# Patient Record
Sex: Female | Born: 1960 | Race: White | Hispanic: No | Smoking: Current every day smoker
Health system: Southern US, Community
[De-identification: ages and names within clinical notes are randomized; demographics above are authoritative.]

## PROBLEM LIST (undated history)

## (undated) DIAGNOSIS — I639 Cerebral infarction, unspecified: Secondary | ICD-10-CM

## (undated) HISTORY — PX: ABDOMINAL HYSTERECTOMY: SHX81

## (undated) HISTORY — PX: REPLACEMENT TOTAL KNEE: SUR1224

## (undated) HISTORY — PX: SHOULDER SURGERY: SHX246

## (undated) HISTORY — PX: TONSILLECTOMY: SUR1361

---

## 2005-04-04 ENCOUNTER — Ambulatory Visit: Payer: Self-pay | Admitting: Physical Medicine & Rehabilitation

## 2005-04-04 ENCOUNTER — Encounter: Payer: Self-pay | Admitting: Cardiology

## 2005-04-04 ENCOUNTER — Inpatient Hospital Stay (HOSPITAL_COMMUNITY)
Admission: RE | Admit: 2005-04-04 | Discharge: 2005-05-10 | Payer: Self-pay | Admitting: Physical Medicine & Rehabilitation

## 2005-04-04 ENCOUNTER — Ambulatory Visit: Payer: Self-pay | Admitting: Cardiology

## 2007-04-22 ENCOUNTER — Ambulatory Visit: Payer: Self-pay | Admitting: Cardiology

## 2010-10-25 ENCOUNTER — Ambulatory Visit: Payer: Self-pay | Admitting: Cardiology

## 2011-04-27 NOTE — H&P (Signed)
NAMEBRUCHA, AHLQUIST NO.:  [id number]   MEDICAL RECORD NO.:  [id number]          PATIENT TYPE:  IPS   LOCATION:  4003                         FACILITY:  [facility]   PHYSICIAN:  Ranelle Oyster, M.D.DATE OF BIRTH:  26-Jan-1961   DATE OF ADMISSION:  04/04/2005  DATE OF DISCHARGE:                                HISTORY & PHYSICAL   MEDICAL RECORD NUMBER:  [id number].   CHIEF COMPLAINT:  Left-sided weakness.   HISTORY OF PRESENT ILLNESS:  This is a 50 year old white female with a  history of uterine cancer in the 76s.  She was admitted to [facility] on March 29, 2005, with acute left-sided weakness.  MRI ultimately  revealed an acute infarction in the right corona radiata.  She was started  on Plavix for stroke prophylaxis.  The patient has had no further workup by  the records sent to [place].  The patient does state that she had carotid  Dopplers performed.  The patient has complained of headaches since the  stroke.  She has had some problems tolerating pain medications for the  headaches.  There have also been difficulties with sleep.  She cannot  tolerate morphine.   REVIEW OF SYSTEMS:  The patient reports occasional heart palpitations,  reflux, history of urinary hesitancy and problems with retention since the  stroke, low back pain, weakness, insomnia and headaches as mentioned above.   PAST MEDICAL HISTORY:  Significant for:  1.  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  The patient has a history of lumbar      disk disease with laminectomy in the 1990s.  2.  History of bilateral total knee replacements at age 89.  3.  History of left shoulder replacements x 2.   FAMILY HISTORY:  Positive for stroke.   SOCIAL HISTORY:  The patient is divorced.  She lives with her son on the  weekends.  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  She smoked two packs per  day prior to coming in and also did  marijuana occasionally.  She denies  alcohol use.   FUNCTIONAL HISTORY:  The patient was independent prior to arrival.  Currently the patient is maximum assistance for pivot transfers and bed  mobility.   MEDICATIONS:  Medications prior to coming in were none.   ALLERGIES:  MORPHINE which caused itching.   LABORATORY VALUES:  Hemoglobin 12.8, white count 9.2, platelets 329.  Sodium  138, potassium 3.6, BUN 7, creatinine 0.8.  Triglycerides 202, cholesterol  216, LDL 151, HDL 25.   PHYSICAL EXAMINATION:  VITAL SIGNS:  On physical examination today, the  patient's pulse was 84, respiratory rate 12, blood pressure stable and she  was afebrile.  GENERAL APPEARANCE:  The patient is generally pleasant and in no acute  distress.  HEENT:  Pupils equally round and reactive to light and accommodation.  Extraocular eye movements were grossly intact.  The oral mucosa was pink and  moist.  NECK:  Supple without JVD or adenopathy.  LUNGS:  Clear bilaterally without wheezing, rales or rhonchi.  HEART:  Regular rate and rhythm without murmurs, rubs or gallops.  ABDOMEN:  Positive bowel sounds.  The abdomen was nontender today.  NEUROLOGIC:  Gait was not examined.  The patient had abnormalities in  cranial nerve exam consisting of left-sided facial droop.  Other cranial  nerves seemed to be appropriate.  Reflexes were hyperactive on the left side  when compared to the right.  I graded the left-sided reflexes at 2++.  Sensation appeared to be grossly intact.  The patient had a left  inattention.  Visual fields appeared to be intact with cues.  The patient  cried and was emotionally labile at times.  Motor function on the right side  was 5/5.  She has some trace deltoids today on the left, but otherwise 0/5  in the upper extremity.  There may have been trace to 1 hip flexor movement  with 0/5 distally.  The patient had tone in the left leg, particularly with  the knee graded at 1+/4 and the ankle  at 2/4.   ASSESSMENT AND PLAN:  1.  Functional deficits secondary to right corona radiata stroke.  The      patient has left-sided weakness with dysarthria, inattention and      spasticity.  Begin comprehensive inpatient rehabilitation to include PT,      OT, speech, M.D. and nursing.  The estimated length of stay is 2+ weeks.      Prognosis is fair.  The patient will go home with her daughter, who      appears supportive.  2.  Stroke workup.  Will check to see if carotid Dopplers were done at      [facility].  If they were not, will proceed with having these      done here at [facility].  Also will have a 2-D echocardiogram done of      the heart.  Also would like to check homocystine levels and a      coagulopathy panel considering the patient's age and cancer hx.  Will      check lupus anticoagulant.  Consider performing an MRA as well.  3.  Headaches.  Will change the patient to Ultram to avoid sedation.      Hopefully these will improve over time.  4.  Deep venous thrombosis prophylaxis with subcutaneous Lovenox.  5.  Dyslipidemia.  Add Zocor.  6.  Sleep.  Will add scheduled trazodone.  7.  Bladder.  The patient had problems with voiding prior to transfer.  Will      continue Foley for now and observe as the patient's mobility improves.  8.  Emotional lability.  Appears to be more related to her right brain      stroke.  Will observe for now.  Consider psychological evaluation as      appropriate.      ZTS/MEDQ  D:  04/04/2005  T:  04/04/2005  Job:  [id number]

## 2011-04-27 NOTE — Consult Note (Signed)
[name], Howe NO.:  [id number]   MEDICAL RECORD NO.:  [id number]          PATIENT TYPE:  IPS   LOCATION:  4003                         FACILITY:  [facility]   PHYSICIAN:  Marlan Palau, M.D.  DATE OF BIRTH:  [birth year]   DATE OF CONSULTATION:  04/05/2005  DATE OF DISCHARGE:                                   CONSULTATION   HISTORY OF PRESENT ILLNESS:  Amy Howe is a 50 year old right-handed  white female born 1961-06-22 with a history of tobacco abuse.  This patient  comes to [facility] rehabilitation center for evaluation following  a right brain stroke that occurred on March 29, 2005.  The patient claims  she awoke with a deficit that mainly was associated with pure motor deficit  on the left.  The patient underwent an MRI scan at [facility], did  undergo a carotid Doppler study that was relatively unremarkable.  MRI of  the brain showed a centrum semiovale infarct on the right.  The patient was  sent to rehabilitation for further evaluation and was placed on Plavix.  Neurology was asked to evaluate this patient to determine whether any  further neurologic work-up needs to be undertaken at this point.   PAST MEDICAL HISTORY:  1.  Significant for history of recurrent centrum semiovale infarct on the      right with left hemiparesis as a pure motor deficit.  Some gaze      preference to the right is noted.  2.  Tobacco abuse.  3.  Bilateral arthroscopic knee surgery.  4.  Left rotator cuff surgery.  5.  Gallbladder resection.  6.  Hysterectomy.  7.  Lumbosacral spine surgery.  8.  Obesity.  9.  Benign tumor resection from the right axilla.   The patient had been smoking two packs of cigarettes a day, does not drink  alcohol, claims that she does use marijuana on occasion, denies use of drug  such as heroin or cocaine.   ALLERGIES:  The patient states that MORPHINE causes itching, otherwise no  drug allergies.   CURRENT  MEDICATIONS:  1.  Baclofen 10 mg twice daily.  2.  Plavix 75 mg a day.  3.  Low molecular weight heparin.  4.  Lovenox 30 mg subcu every 12 hours.  5.  Pepcid 20 mg every 12 hours.  6.  Prinivil 5 mg daily.  7.  Lopressor 25 mg twice daily.  8.  Nicotine patch.  9.  Zocor 20 mg at bedtime.  10. Trazodone 50 mg at bedtime.  11. Guaiafenesin if needed.  12. Ultram if needed.   SOCIAL HISTORY:  This patient is currently not married, does have three  children.  Works as a Merchandiser, retail.  Lives in the [place] area.   FAMILY HISTORY:  Mother is alive, does have a history of hypertension,  father died following an infection that was hospital acquired.  The patient  has one sister who is alive and well.  Does have a history of seizures.  There is a family history of  heart disease.   REVIEW OF SYSTEMS:  Notable for no recent fevers, chills, patient does note  headache associated with the stroke, does note some neck pain, denies  shortness of breath, chest pains, abdominal pain, nausea, vomiting, trouble  controlling the bowels or bladder but patient does have a Foley catheter in  at this time.  The patient denies dizziness or black out episodes.   PHYSICAL EXAMINATION:  VITAL SIGNS:  Blood pressure is 77/46, heart rate 43,  respiratory rate 20, temperature afebrile.  GENERAL:  This patient is a moderately obese white female who is alert  cooperative at time of examination.  HEENT:  Head is atraumatic.  Eyes:  Pupils equal, round and reactive to  light.  Disks are flat bilaterally.  Extraocular movements are full but  patient tends to have a very definite right gaze preference.  NECK:  Supple, no carotid bruits noted.  LUNGS:  Respiratory examination is clear.  CARDIOVASCULAR:  Examination reveals regular rate and rhythm, no obvious  murmurs or rubs noted.  EXTREMITIES:  Without significant edema.  NEUROLOGIC:  Cranial nerves as noted above. The patient does have left  facial  droop.  The patient has no visual field deficits.  Speech is well  enunciated, nonaphasic.  Pinprick sensation of face is symmetric and normal,  slightly asymmetric smile is noted.  Again visual fields are full.  Motor  testing reveals flaccid left arm, left leg, basically no volunteer motor  effort seen.  The patient has good strength on the right side.  Pinprick,  soft touch, vibratory sensation is symmetric throughout.  The patient has  good finger-nose-finger and toe to finger on the right.  Cannot perform on  the left.  Cannot be ambulated.  Patient has depressed reflexes throughout.  Toes are neutral bilaterally.   LABORATORY DATA:  Notable for a white blood cell count 7300, hemoglobin of  12.8, hematocrit 37.8, MCV 85.6, platelet count 241,000.  Sodium 136,  potassium 3.8, chloride of 106, CO2 22, glucose 115, BUN 19, creatinine 1.0,  total bili 0.6, alk 102, SGOT 13, SGPT 12, total protein 6.4, albumin 3.5,  calcium 9.2.   A 2-D echocardiogram has been performed.  Reveals limited study.  Patient  appears to have adequate left and right ventricular function.  This study  again was very limited.   IMPRESSION:  1.  New onset of right brain stroke, centrum semiovale with a left      hemiparesis as a pure motor event.  2.  Tobacco abuse.   The patient is relatively young at age 89.  The patient's main risk factor  is significant tobacco abuse.  The patient deserves further work-up to  include evaluation of hypercoagulable state given the fact that she is less  than age 62.  It is not clear whether the patient received MRI angiography  evaluation.   PLAN:  1.  MRI angiogram of the intracranial extracranial vessels.  2.  Hypercoagulable state work-up.  3.  Homocysteine level.  4.  Continue Plavix for now.  5.  Transcranial Doppler bubble study, particularly given the fact that     echocardiogram was poor quality, rule out patent foramen ovale.   Will follow the patient's  clinical course while in house.      CKW/MEDQ  D:  04/05/2005  T:  04/06/2005  Job:  [id number]   cc:   Erick Colace, M.D.  [address]  [place]  [place] [address]  Fax: [phone]   Dr. Wende Crease

## 2011-04-27 NOTE — Discharge Summary (Signed)
Amy Howe, Amy Howe NO.:  [id number]   MEDICAL RECORD NO.:  [id number]          PATIENT TYPE:  IPS   LOCATION:  4029                         FACILITY:  [facility]   PHYSICIAN:  Erick Colace, M.D.DATE OF BIRTH:  08/12/1961   DATE OF ADMISSION:  04/04/2005  DATE OF DISCHARGE:                                 DISCHARGE SUMMARY   DISCHARGE DIAGNOSIS:  1.  Cerebrovascular accident secondary to MCA infarction.  2.  Positive anti-cardiolipid antibody and positive lupus anticoagulant      requiring long term Coumadin.  3.  Urinary retention.  4.  Klebsiella urinary tract infection.  5.  Spastic left hemiparesis.   HISTORY OF PRESENT ILLNESS:  Amy Howe is a 50 year old female with a  history of uterine cancer in the 1990s, otherwise, in good health until  admitted to [facility] April 20 with acute onset of left sided  weakness.  CT of the head done was negative.  MRI showed acute infarction.  Patient started on Plavix for CVA prophylaxis.  She was noted to have some  dysarthria and no dysphagia, left facial weakness, left hemiparesis noted to  continue, the patient has had complaints of headache ever since the stroke.  Question of allergic reaction to morphine.  Therapy is initiated and the  patient is at max assist for pivot to chair, OT working with passive range  of motion of the left upper extremity, CIR was consulted for progression.   PAST MEDICAL HISTORY:  Significant for uterine cancer with metastases to  bladder requiring hysterectomy and partial cystectomy, lumbar HNP with  lumbar lam in 1990s, bilateral knee arthroscopies, left shoulder replacement  versus arthroscopy.   ALLERGIES:  Morphine.   FAMILY HISTORY:  Positive for CVA and PE.   SOCIAL HISTORY:  The patient is divorced, has a son who lives with her on  the weekends.  The patient was working as a Merchandiser, retail at [employer].  She  smokes two packs per day, occasional marijuana use,  does not use any  alcohol.  The patient was independent and active prior to admission.   HOSPITAL COURSE:  Ms. Amy Howe was admitted to [facility] on April 04, 2005,  for inpatient therapies to consist of PT and OT daily.  Past admission,  subcu Lovenox was added for DVT prophylaxis.  The patient's admission lab  showed dyslipidemia with triglycerides 202, cholesterol 216, HDL 25, LDL  151.  She was started on Zocor for this.  LFTs checked past admission showed  liver function tests to be within normal limits with AST at 16, ALT 20,  alkaline phos 112, T-bili 0.5.  As part of complete workup, MRA of the brain  was ordered and coagulopathy panel was ordered.  Dr. Pearlean Brownie was consulted for  input and recommended MRI of the brain and MRA of head and neck.  MRI of the  brain showed acute right MCA infarction, MRA of head showed occlusion versus  high risk stenosis proximal right MCA with question of embolus or possible  atherosclerotic disease.  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  Cardiac echo done was  inadequate for evaluation of left ventricular wall motion.  Images were  limited with LV and RV function probably OK, difficult to comment on  question of possible cardiac source of embolus.  Coagulopathy panel done was  negative.  Lupus anticoagulant was detected at high of 84.1, PTLA  confirmation, DRVGT at 84.4, and DRVTT confirmation at 2.31.  Protein C and  protein S total were within normal limits.  Protein S functional was low at  76, protein C functional was high at 166.  Homocystine level initially low  at 11.46, repeat was high normal at 14.44, hemoglobin A1C was normal at 5.6.  UDS check was positive for benzodiazepine.  CBC done at admission showed  hemoglobin 12.8, hematocrit 37.8, white count 7.3, platelets 241.  Check of  electrolytes revealed sodium 136, potassium 3.8, chloride 106, CO2 22, BUN  19, creatinine 1, glucose  115.  Secondary to positive lupus anticoagulant  and cardiolipid antibody, Coumadin was recommended and the patient was  started on this for CVA prophylaxis.  The patient was also started on low  dose ACE to help with blood pressure maintenance.  She has had complaints of  daily headaches.  Initially, oxycodone was used.  However, patient with  complaints of severe constipation on this, therefore, this was discontinued  and currently Midrin used on p.r.n. basis for headaches.  The patient  continues with left spastic hemiparesis.  She was started on Baclofen with  some relief in spasticity, Zanaflex was added and  slowly titrated up to  current dose of 8 mg a day and Baclofen has been tapered off.  The patient  has also had issues with constipation with exacerbation of her hemorrhoids.  She was started on bowel regimen of 2 Senokot on b.i.d. basis and she is to  continue on this past discharge.  The patient was admitted with Foley  catheter in place secondary to retention issues.  Once the patient's Foley  catheter was discontinued, she was noted to have problems voiding.  She was  started on Flomax 0.4 mg q.h.s. and Urecholine was added and dose slowly  increased.  However, the patient was noted to have issues with hypotension  and no spontaneous void, therefore, she has been tapered off Urecholine  currently.  A Foley catheter was replaced on May 29 as she had failed  voiding trial.  The patient is to follow up with urology on outpatient basis  for repeat voiding trial in the future.   Resting left hand strength was ordered to help prevention flexion  contracture, left AFO has also been ordered to help with ambulation and  gait.  There was a problem with left buttock cyst inflammation that had I&D.  She has also had Botox injection of left hamstring and left flexure of  forearm for spastic hemiparesis.  She was noted to have depressed mood and Dr. Leonides Cave, neuropsych, has been following  along.  She has also exhibited  high levels of anxiety in part secondary to her current situation and  multiple family issues.  She was started on Zoloft with dose being increased  to 100 mg p.o. daily, Klonopin has been used on b.i.d. basis p.r.n.  She is  set up to follow up with Dr. Leonides Cave, neuropsych, past discharge.  During her  stay in rehab, the patient has made slow steady progress.  Currently, she is  modified independent for  bed mobility, modified independent for supine to  sit transfers, min assist with sit to stand transfers, she is total assist  60% with ambulating 40 feet, 65% ambulation with light gait with assist of  left lower extremity.  She is currently at set up with supervision for upper  body bathing, min assist for lower body bathing, min verbal cueing with min  assist for dressing.  She is at min assist for toileting.  Speech therapy  has been ongoing for dysarthria and for high level comprehension.  Currently, the patient is able to follow multi-step command without  difficulty.  Reading and writing is compatible with her needs.  Basic high  level expression is intact.  Mild dysarthria noted without apraxia.  The  patient will continue with follow up Home Health PT and OT past discharge,  Home Health nurse to draw protime next on June 5 with results to Dr.  Doyne Keel, Dr. Doyne Keel has graciously agreed to follow the patient's Coumadin  and he will follow up with the patient as primary care physician past  discharge.  On May 10, 2005, the patient is discharged to home.   DISCHARGE MEDICATIONS:  Coumadin 2 mg q.p.m. on Monday, Wednesday, Friday,  and Saturday, 1.5 mg p.o. Tuesday, Thursday and Sunday, Prinivil 2.5 mg  daily, Zanaflex 8 mg q.h.s., Flomax 0.4 mg q.h.s., Zoloft 100 mg daily,  Cipro 250 mg b.i.d., Zocor 20 mg q.h.s., Simethicone 80 mg a.c. and h.s.,  Senokot S 2 p.o. b.i.d., Nicoderm patch 14 mg change daily, Ultram 50 mg  q.i.d. p.r.n. pain, Klonopin 0.5  mg b.i.d. p.r.n., Midrin 1-2 p.o. q.8h.  p.r.n. headaches.   DISCHARGE INSTRUCTIONS:  Diet is balanced low fat.  Activities:  Up with  assistance as tolerated with the use of a walker.  Use splint left arm with  sling p.r.n., use AFO left lower extremity.  The patient is to follow up  with Dr. Doyne Keel in two weeks, follow up with Dr. Wynn Banker June 18, 2005,  at 12:30, follow up with Dr. Kieth Brightly May 14, 2005, 10:15 a.m., follow up  with Dr. Rito Ehrlich May 16, 2005, at 4 p.m.      PP/MEDQ  D:  05/10/2005  T:  05/10/2005  Job:  [id number]   cc:   Pramod P. Pearlean Brownie, MD  Fax: [phone]   Forrest Moron   Dennie Maizes, M.D.  Fax: [phone]   Hershal Coria, Psy.D.  [address]  [place]  [place] [address]  Fax: [phone]

## 2011-04-27 NOTE — Consult Note (Signed)
NAMEVANDY, FONG                ACCOUNT NO.:  [id number]   MEDICAL RECORD NO.:  [id number]          PATIENT TYPE:  IPS   LOCATION:  4003                         FACILITY:  [facility]   PHYSICIAN:  Pramod P. Pearlean Brownie, MD    DATE OF BIRTH:  March 11, 1961   DATE OF CONSULTATION:  DATE OF DISCHARGE:                                   CONSULTATION   REASON FOR CONSULTATION:  Stroke.   HISTORY OF PRESENT ILLNESS:  Ms. Meara is a 50 year old Caucasian lady who  developed sudden onset of left hemiplegia on March 29, 2005, upon awakening  from sleep.  She was [place] at [facility] [place]  and underwent a MRI scan of the brain which showed a right corona radiata  infarction.  A 2-D echo and carotid arteries were unremarkable.  She was  started on Plavix for stroke prevention and transferred here for rehab.  She  has no prior history of strokes, MI or deep venous thrombosis.  She,  however, does have a strong family history of MI in the 61s in both parents.   PAST MEDICAL HISTORY:  Otherwise unremarkable except for hypertension and  hyperlipidemia.  She does have some headaches that she does not think are  migraines but could well be.   CURRENT MEDICATIONS:  1.  Plavix.  2.  Lopressor.  3.  Trazodone.  4.  Baclofen.  5.  Lisinopril.  6.  Zocor.  7.  Lovenox.   ALLERGIES:  MORPHINE causes itching.   PAST SURGICAL HISTORY:  1.  Hysterectomy.  2.  Partial __________.  3.  Knee replacement.  4.  Left shoulder replacement.   SOCIAL HISTORY:  She is divorced.  She works as a Merchandiser, retail.  She use to  smoke two packs per day and quite following the stroke.  She admits to doing  marijuana in the past but not recently.   PHYSICAL EXAMINATION:  GENERAL APPEARANCE:  A pleasant, obese Caucasian lady  who is not in distress.  VITAL SIGNS:  She is afebrile, pulse rate 72 per minute regular, respiratory  rate 16 per minute, distal pulses were preserved.  HEENT:  Atraumatic.   ENT exam is unremarkable.  NECK:  Supple without bruit.  LUNGS:  Clear to auscultation.  CARDIOVASCULAR:  No murmur or gallop.  NEUROLOGIC:  She is pleasant, awake, alert and cooperative.  There is no  aphasia or apraxia.  No dysarthria though there is clear left facial  weakness.  Eye movements are full range with slight restriction of left  lateral gaze on left side.  Visual fields seem full to confrontational  testing, though I doubt cooperation.  There may be slightly diminished left  parietal field of vision.  Palatal movements are normal.  Tongue is midline.  Motor reveals flaccid hemiplegia on the left with 0/5 strength.  Reflexes  are elevated on the left, plantars, right is downgoing, left is upgoing.  There is no sensory loss.  Gait was not tested.   LABORATORY REVIEW:  MRI scan of the brain done at [facility]  apparently shows  a right corona radiata infarct with actual report or films  are not available.   Homocystine level is normal.  Cholesterol is 216, LDL 151.   MRA and hypercoagulable panel are pending.   IMPRESSION:  A 50 year old lady with large right subcortical infarct which  would be appropriate to do a transcranial Doppler bubble study to look for  intracardiac shunt and evidence of paradoxical embolism.   After taking verbal informed consent from the patient and given her  pertinent precautions, explaining risks and benefits, transcranial Doppler  bubble study was performed at the bedside.  The right middle cerebral artery  was insonated using a 2 MHz probe.  IV line had previously been inserted in  the left wrist.  A liter of saline was injected one time at wrist and no  high intensity transient signals or HITS were noted.  The procedure was  repeated twice followed by Valsalva maneuver.  The patient's cooperation for  Valsalva maneuver was adequate on one occasion and suboptimal on the second.  No HITS were, again, identified.   IMPRESSION:   Negative transcranial Doppler bubble study.  No evidence of  right-to-left intracardiac communication by this study.  This was explained  to the patient.  Given the young age, I would recommend further evaluation  for stroke in the form of transesophageal echocardiogram to rule out cardiac  source of embolism as well as obtain MRA of the brain and neck.  Discontinue  Plavix and change to Aggrenox for secondary to stroke prevention.  Also  check urine drug screen.  I will be happy to follow the patient in consult,  call for questions.      PPS/MEDQ  D:  04/06/2005  T:  04/06/2005  Job:  [id number]

## 2011-04-27 NOTE — Consult Note (Signed)
Amy Howe, Amy Howe NO.:  [id number]   MEDICAL RECORD NO.:  [id number]          PATIENT TYPE:  IPS   LOCATION:  4029                         FACILITY:  [facility]   PHYSICIAN:  Genene Churn. Love, M.D.    DATE OF BIRTH:  Mar 22, 1961   DATE OF CONSULTATION:  04/18/2005  DATE OF DISCHARGE:                                   CONSULTATION   REASON FOR CONSULTATION:  I am asked to see this 50 year old, right-handed,  white, divorced female for evaluation of abnormal studies.   HISTORY OF PRESENT ILLNESS:  Amy Howe has a 15-year history of  hypertension, a known history of chronic two-pack per day cigarette use and  uterine cancer.  She was admitted to [facility] on March 29, 2005,  after awakening in the morning with the acute onset of a left hemiparesis.  MRI study was performed at that hospital and thought to show evidence of a  right-sided subcortical stroke.  She was transferred to the [facility] on April 04, 2005.  Workup in the hospital  has included an MRI study of the brain on April 06, 2005, showing a large  right middle cerebral artery stroke, MRI of the intracranial circulation  showing right MCA stenosis or occlusion, an external evaluation showing  internal carotid artery stenosis of 50-80% with possible web-like findings,  Doppler studies from [place], [place], being negative other carotids,  cervical spine showing normal curvature of the cervical spine, a positive  anticardiolipin antibody with IgG of 101, IgM of 18, IgA of 26, positive  lupus anticoagulant with values of 84.1, PTTLA confirmation 0.0, DRVTT 84.4,  DRVTT confirmation 2.31, lupus anticoagulant detected, negative protein C  total, negative protein C functional, normal protein S total, low functional  protein S of 76, negative factor V Leiden, homocystine values of 14.44 and  11.46, urine screen positive for benzodiazepines and negative for cocaine,  negative beta glyco 2 protein 1 study, a 2-D echocardiogram showing  inadequate study for left ventricular regional wall motion, images very  limited, a transcranial Doppler study which was negative, a 12-lead EKG from  [facility] showing normal sinus rhythm and borderline left axis deviation and  a lipid profile that is not in the chart, but the patient is on antilipid  therapy.   MEDICATIONS:  Current medications are nicotine patch, lisinopril, Zocor,  Visine eyedrops, Aggrenox, Lioresal, Lovenox, Zoloft, Flonase and Senokot.   FAMILY HISTORY:  The patient gives a positive family history of pulmonary  emboli in that her father had pulmonary emboli.   PERSONAL HISTORY:  The patient has had no personal history of spontaneous  abortion or pulmonary emboli.   PHYSICAL EXAMINATION:  GENERAL APPEARANCE:  A well-developed, pleasant,  white female with splint on her left hand.  VITAL SIGNS:  Her blood pressure in the right and left arms was 110/60.  Her  heart rate was 64.  There were no bruits.  NEUROLOGIC:  On mental status, she was alert and oriented x 3.  Her mental  status revealed there was no denial of  illness on the left side or denial of  weakness.  Her MMSE was 29/30.  Cranial nerve examination revealed visual  fields full, disks flat, spontaneous venous pulsation seen, left seventh, no  visual field cut, tongue midline, uvula midline and gags present.  Sternocleidomastoid and trapezius with some movement of the left shoulder.  She had some tone in the left hand and arm, but was otherwise 1/5 in her  left leg.  She was 3/5 proximally and 0-1/5 distally.  She had good intact  pinprick to light touch, joint position and vibration testing.  The deep  tendon reflexes were 2+.  Left plantar response was upgoing.   IMPRESSION:  1.  Right middle cerebral artery stroke.  434.01  2.  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  433.11  3.  Positive anticardiolipin antibody and  positive lupus anticoagulant.   PLAN:  The plan at this time is to consider taking her off of Aggrenox and  placing her on long-term Coumadin therapy.      JML/MEDQ  D:  04/18/2005  T:  04/18/2005  Job:  [id number]

## 2015-10-06 ENCOUNTER — Encounter (HOSPITAL_COMMUNITY): Payer: Self-pay | Admitting: Emergency Medicine

## 2015-10-06 ENCOUNTER — Emergency Department (HOSPITAL_COMMUNITY): Payer: Medicare Other

## 2015-10-06 ENCOUNTER — Inpatient Hospital Stay (HOSPITAL_COMMUNITY)
Admission: EM | Admit: 2015-10-06 | Discharge: 2015-10-14 | DRG: 689 | Disposition: A | Discharge status: Skilled Nursing Facility | Payer: Medicare Other | Attending: Internal Medicine | Admitting: Internal Medicine

## 2015-10-06 DIAGNOSIS — N179 Acute kidney failure, unspecified: Secondary | ICD-10-CM | POA: Diagnosis present

## 2015-10-06 DIAGNOSIS — Z7401 Bed confinement status: Secondary | ICD-10-CM

## 2015-10-06 DIAGNOSIS — I69354 Hemiplegia and hemiparesis following cerebral infarction affecting left non-dominant side: Secondary | ICD-10-CM

## 2015-10-06 DIAGNOSIS — Z79899 Other long term (current) drug therapy: Secondary | ICD-10-CM

## 2015-10-06 DIAGNOSIS — G9341 Metabolic encephalopathy: Secondary | ICD-10-CM | POA: Diagnosis not present

## 2015-10-06 DIAGNOSIS — R4589 Other symptoms and signs involving emotional state: Secondary | ICD-10-CM | POA: Diagnosis present

## 2015-10-06 DIAGNOSIS — R410 Disorientation, unspecified: Secondary | ICD-10-CM | POA: Diagnosis not present

## 2015-10-06 DIAGNOSIS — N390 Urinary tract infection, site not specified: Secondary | ICD-10-CM | POA: Diagnosis present

## 2015-10-06 DIAGNOSIS — F489 Nonpsychotic mental disorder, unspecified: Secondary | ICD-10-CM | POA: Diagnosis not present

## 2015-10-06 DIAGNOSIS — R45851 Suicidal ideations: Secondary | ICD-10-CM | POA: Diagnosis present

## 2015-10-06 DIAGNOSIS — I639 Cerebral infarction, unspecified: Secondary | ICD-10-CM | POA: Diagnosis not present

## 2015-10-06 DIAGNOSIS — D539 Nutritional anemia, unspecified: Secondary | ICD-10-CM | POA: Diagnosis present

## 2015-10-06 DIAGNOSIS — Z96659 Presence of unspecified artificial knee joint: Secondary | ICD-10-CM | POA: Diagnosis present

## 2015-10-06 DIAGNOSIS — N3000 Acute cystitis without hematuria: Principal | ICD-10-CM | POA: Diagnosis present

## 2015-10-06 DIAGNOSIS — R4689 Other symptoms and signs involving appearance and behavior: Secondary | ICD-10-CM

## 2015-10-06 DIAGNOSIS — F17200 Nicotine dependence, unspecified, uncomplicated: Secondary | ICD-10-CM | POA: Diagnosis present

## 2015-10-06 HISTORY — DX: Cerebral infarction, unspecified: I63.9

## 2015-10-06 LAB — BLOOD GAS, VENOUS
Acid-base deficit: 2.4 mmol/L — ABNORMAL HIGH (ref 0.0–2.0)
BICARBONATE: 21.5 meq/L (ref 20.0–24.0)
O2 Content: 2 L/min
O2 Saturation: 68.3 %
PO2 VEN: 38.6 mmHg (ref 30.0–45.0)
pCO2, Ven: 46 mmHg (ref 45.0–50.0)
pH, Ven: 7.316 — ABNORMAL HIGH (ref 7.250–7.300)

## 2015-10-06 LAB — URINALYSIS, ROUTINE W REFLEX MICROSCOPIC
Glucose, UA: NEGATIVE mg/dL
Ketones, ur: NEGATIVE mg/dL
NITRITE: NEGATIVE
PH: 6 (ref 5.0–8.0)
SPECIFIC GRAVITY, URINE: 1.02 (ref 1.005–1.030)
Urobilinogen, UA: 0.2 mg/dL (ref 0.0–1.0)

## 2015-10-06 LAB — COMPREHENSIVE METABOLIC PANEL
ALK PHOS: 251 U/L — AB (ref 38–126)
ALT: 15 U/L (ref 14–54)
ANION GAP: 8 (ref 5–15)
AST: 34 U/L (ref 15–41)
Albumin: 1.7 g/dL — ABNORMAL LOW (ref 3.5–5.0)
BILIRUBIN TOTAL: 0.6 mg/dL (ref 0.3–1.2)
BUN: 20 mg/dL (ref 6–20)
CALCIUM: 9.2 mg/dL (ref 8.9–10.3)
CO2: 23 mmol/L (ref 22–32)
CREATININE: 1.69 mg/dL — AB (ref 0.44–1.00)
Chloride: 107 mmol/L (ref 101–111)
GFR calc non Af Amer: 33 mL/min — ABNORMAL LOW (ref >60)
GFR, EST AFRICAN AMERICAN: 39 mL/min — AB (ref >60)
GLUCOSE: 73 mg/dL (ref 65–99)
Potassium: 4.3 mmol/L (ref 3.5–5.1)
SODIUM: 138 mmol/L (ref 135–145)
TOTAL PROTEIN: 5.1 g/dL — AB (ref 6.5–8.1)

## 2015-10-06 LAB — URINE MICROSCOPIC-ADD ON

## 2015-10-06 LAB — CBC WITH DIFFERENTIAL/PLATELET
Basophils Absolute: 0 10*3/uL (ref 0.0–0.1)
Basophils Relative: 1 %
EOS ABS: 0.1 10*3/uL (ref 0.0–0.7)
Eosinophils Relative: 2 %
HEMATOCRIT: 35.1 % — AB (ref 36.0–46.0)
HEMOGLOBIN: 11.2 g/dL — AB (ref 12.0–15.0)
LYMPHS ABS: 1.8 10*3/uL (ref 0.7–4.0)
LYMPHS PCT: 29 %
MCH: 32.9 pg (ref 26.0–34.0)
MCHC: 31.9 g/dL (ref 30.0–36.0)
MCV: 103.2 fL — ABNORMAL HIGH (ref 78.0–100.0)
MONOS PCT: 10 %
Monocytes Absolute: 0.6 10*3/uL (ref 0.1–1.0)
NEUTROS ABS: 3.6 10*3/uL (ref 1.7–7.7)
NEUTROS PCT: 58 %
Platelets: 263 10*3/uL (ref 150–400)
RBC: 3.4 MIL/uL — AB (ref 3.87–5.11)
RDW: 14.1 % (ref 11.5–15.5)
WBC: 6.2 10*3/uL (ref 4.0–10.5)

## 2015-10-06 LAB — PROCALCITONIN: Procalcitonin: 0.73 ng/mL

## 2015-10-06 LAB — CBG MONITORING, ED: Glucose-Capillary: 73 mg/dL (ref 65–99)

## 2015-10-06 LAB — I-STAT CG4 LACTIC ACID, ED: Lactic Acid, Venous: 1.04 mmol/L (ref 0.5–2.0)

## 2015-10-06 MED ORDER — SODIUM CHLORIDE 0.9 % IV BOLUS (SEPSIS): 1000.0000 mL | Freq: Once | INTRAVENOUS | Status: DC

## 2015-10-06 MED ORDER — SODIUM CHLORIDE 0.9 % IV BOLUS (SEPSIS)
1000.0000 mL | Freq: Once | INTRAVENOUS | Status: AC
  Administered 2015-10-06: 1000 mL via INTRAVENOUS

## 2015-10-06 MED ORDER — ONDANSETRON HCL 4 MG/2ML IJ SOLN
4.0000 mg | Freq: Four times a day (QID) | INTRAMUSCULAR | Status: DC | PRN
  Administered 2015-10-10: 4 mg via INTRAVENOUS
  Filled 2015-10-06: qty 2

## 2015-10-06 MED ORDER — ONDANSETRON HCL 4 MG PO TABS
4.0000 mg | ORAL_TABLET | Freq: Four times a day (QID) | ORAL | Status: DC | PRN
Start: 2015-10-06 — End: 2015-10-14
  Administered 2015-10-09: 4 mg via ORAL
  Filled 2015-10-06: qty 1

## 2015-10-06 MED ORDER — CEFTRIAXONE SODIUM 1 G IJ SOLR
1.0000 g | Freq: Once | INTRAMUSCULAR | Status: AC
  Administered 2015-10-06: 1 g via INTRAVENOUS
  Filled 2015-10-06: qty 10

## 2015-10-06 MED ORDER — SODIUM CHLORIDE 0.9 % IV BOLUS (SEPSIS)
1000.0000 mL | Freq: Once | INTRAVENOUS | Status: AC
Start: 2015-10-06 — End: 2015-10-06
  Administered 2015-10-06: 1000 mL via INTRAVENOUS

## 2015-10-06 MED ORDER — SODIUM CHLORIDE 0.9 % IV SOLN
INTRAVENOUS | Status: DC
  Administered 2015-10-06: 23:00:00 via INTRAVENOUS

## 2015-10-06 MED ORDER — DEXTROSE 5 % IV SOLN
1.0000 g | INTRAVENOUS | Status: DC
  Administered 2015-10-07 – 2015-10-08 (×2): 1 g via INTRAVENOUS
  Filled 2015-10-06 (×4): qty 10

## 2015-10-06 MED ORDER — CEPHALEXIN 500 MG PO CAPS: 500.0000 mg | ORAL_CAPSULE | Freq: Four times a day (QID) | ORAL | Status: DC

## 2015-10-06 NOTE — ED Provider Notes (Addendum)
CSN: [id number]     Arrival date & time 10/06/15  1436 History   First MD Initiated Contact with Patient 10/06/15 1438     Chief Complaint  Patient presents with  . Medical Clearance     (Consider location/radiation/quality/duration/timing/severity/associated sxs/prior Treatment) HPI   54 year old female who presents for medical clearance. History of CVA with left-sided hemiplegia, and presents from [facility] States that over the course of the past few weeks she has been having decreased appetite, generalized weakness, and overall feeling unwell. She has felt "hot and cold " but without fevers. She is also noted urinary frequency with dysuria. Incontinent of urine at baseline. Has not had any abdominal pain, flank pain, nausea or vomiting, diarrhea, chest pain, difficulty breathing, cough, or other respiratory symptoms. Has been feeling more tired recently, but denies any falls, new vision changes or speech changes, new focal weakness or numbness.  Was sent from  [facility] for medical clearance and psych evaluation today. She had apparently said "I just don't want to be here anymore." She states that statement was meant that she didn't want to be at the facility currently, and not that she didn't want to be alive. Patient denies any suicidal or homicidal thoughts, hallucinations, or paranoia.    on further discussion with [facility], they're concerned as she has become more verbally aggressive and combative recently. Currently receiving IM antibiotics for treatment of antibiotics there. They also says that they have found a switch blade in her bed, and that she has been telling them that she had wanted to hurt her self.  Past Medical History  Diagnosis Date  . Stroke [facility])    Past Surgical History  Procedure Laterality Date  . Tonsillectomy    . Replacement total knee    . Shoulder surgery    . Abdominal hysterectomy     History reviewed. No pertinent family history. Social  History  Substance Use Topics  . Smoking status: Current Every Day Smoker  . Smokeless tobacco: None  . Alcohol Use: No   OB History    No data available     Review of Systems 10/14 systems reviewed and are negative other than those stated in the HPI    Allergies  Review of patient's allergies indicates no known allergies.  Home Medications   Prior to Admission medications   Medication Sig Start Date End Date Taking? Authorizing Provider  citalopram (CELEXA) 20 MG tablet Take 20 mg by mouth daily.   Yes Historical Provider, MD  cephALEXin (KEFLEX) 500 MG capsule Take 1 capsule (500 mg total) by mouth 4 (four) times daily. 10/06/15   Forde Dandy, MD   BP 89/55 mmHg  Pulse 79  Temp(Src) 97.5 F (36.4 C) (Oral)  Resp 21  Ht _0  (1.727 m)  Wt 140 lb (63.504 kg)  BMI 21.29 kg/m2  SpO2 97% Physical Exam Physical Exam  Nursing note and vitals reviewed. Constitutional:  well-nourished, well nourished, non-toxic, but appears dissheveled, sleepy Head: Normocephalic and atraumatic.  Mouth/Throat: Oropharynx is clear. Mucous membranes are dry.  Neck: Normal range of motion. Neck supple.  Cardiovascular: Normal rate and regular rhythm.   Pulmonary/Chest: Effort normal and breath sounds normal.  Abdominal: Soft. Obese. There is no tenderness. There is no rebound and no guarding.  Musculoskeletal: Normal range of motion.  Neurological: sleepy but easily arousable to voice and is fully conversational, no facial droop Skin: Skin is warm and dry.  Psychiatric: Cooperative  ED Course  Procedures (including critical care time) Labs Review Labs Reviewed  URINALYSIS, ROUTINE W REFLEX MICROSCOPIC (NOT AT [facility]) - Abnormal; Notable for the following:    Hgb urine dipstick LARGE (*)    Bilirubin Urine SMALL (*)    Protein, ur TRACE (*)    Leukocytes, UA SMALL (*)    All other components within normal limits  BLOOD GAS, VENOUS - Abnormal; Notable for the following:    pH, Ven  7.316 (*)    Acid-base deficit 2.4 (*)    All other components within normal limits  CBC WITH DIFFERENTIAL/PLATELET - Abnormal; Notable for the following:    RBC 3.40 (*)    Hemoglobin 11.2 (*)    HCT 35.1 (*)    MCV 103.2 (*)    All other components within normal limits  COMPREHENSIVE METABOLIC PANEL - Abnormal; Notable for the following:    Creatinine, Ser 1.69 (*)    Total Protein 5.1 (*)    Albumin 1.7 (*)    Alkaline Phosphatase 251 (*)    GFR calc non Af Amer 33 (*)    GFR calc Af Amer 39 (*)    All other components within normal limits  URINE MICROSCOPIC-ADD ON - Abnormal; Notable for the following:    Squamous Epithelial / LPF FEW (*)    Bacteria, UA FEW (*)    Crystals CA OXALATE CRYSTALS (*)    All other components within normal limits  URINE CULTURE  I-STAT CG4 LACTIC ACID, ED  CBG MONITORING, ED    Imaging Review Dg Chest 1 View  10/06/2015  CLINICAL DATA:  Confusion, lethargy. EXAM: CHEST 1 VIEW COMPARISON:  November 14, 2014. FINDINGS: The heart size and mediastinal contours are within normal limits. Both lungs are clear. No pneumothorax or pleural effusion is noted. The visualized skeletal structures are unremarkable. IMPRESSION: No active disease. Electronically Signed   By: Marijo Conception, M.D.   On: 10/06/2015 17:14   Ct Head Wo Contrast  10/06/2015  CLINICAL DATA:  Patient presents with lethargy. History of a stroke and left-sided hemiplegia. Over the course of the last few weeks patient has had a decreased appetite and generalized weakness. EXAM: CT HEAD WITHOUT CONTRAST TECHNIQUE: Contiguous axial images were obtained from the base of the skull through the vertex without intravenous contrast. COMPARISON:  05/12/2005 FINDINGS: The ventricles are normal configuration. There is ventricular and sulcal enlargement reflecting moderate atrophy, advanced for age. No hydrocephalus. There is an old right-sided infarct. Infarct involves the right base a ganglia extending  to the deep right frontal lobe periventricular white matter. There is hypoattenuation in the white matter of frontal lobe adjacent to the infarct likely due to chronic microvascular ischemic change. This is new from the prior study. There is associated ex vacuo dilation of the right lateral ventricle. There are no parenchymal masses or mass effect. There is no evidence of a recent cortical infarct. There are no extra-axial masses or abnormal fluid collections. There is no intracranial hemorrhage. The visualized sinuses and mastoid air cells are clear. No skull lesion. IMPRESSION: 1. No acute intracranial abnormalities. 2. Old right-sided infarct. The infarct is similar to the prior CT. New hypoattenuation throughout much of the right frontal lobe has developed since the prior study likely due to chronic microvascular ischemic change. 3. Moderate atrophy advanced for age and increased when compared to the prior CT. Electronically Signed   By: Lajean Manes M.D.   On: 10/06/2015 16:56   I have personally reviewed and  evaluated these images and lab results as part of my medical decision-making.   EKG Interpretation   Date/Time:  Thursday October 06 2015 14:45:32 EDT Ventricular Rate:  80 PR Interval:  170 QRS Duration: 121 QT Interval:  386 QTC Calculation: 445 R Axis:   -42 Text Interpretation:  Sinus rhythm Nonspecific IVCD with LAD No prior EKG  for comparison Confirmed by Ludia Gartland MD, Hinton Dyer ([id number]) on 10/06/2015 4:09:40 PM      MDM   Final diagnoses:  Acute cystitis without hematuria    54 year old female with history of CVA  With residual left-sided hemiplegia and incontinence of urine who presents  For medical clearance.  She had clarified that her statement of "I just wanted be here anymore" was meant that she was frustrated with staff and did not want to be at the facility and not that she had wanted to her hurt herself.  She is a little bit sleepy here, but easily arousable to voice, and at  her neurological baseline. She is oriented to self, but occasionally has violent outbursts where she yells that we have locked her up in a store. She is also chewing on her pulse ox and states that the cracker she is eating doesn't taste right. Presentation more consistent with delirium. Evidence of persistent UTI, and given 1 g of ceftriaxone.  Lactate is normal, no significant leukocytosis. No other major metabolic or electrolyte derangements. Chest x-ray without infiltrate and CT head showing chronic ischemic changes. Discussed with [facility] hospitalist, who will met for ongoing management.  Forde Dandy, MD 10/06/15 2159  Forde Dandy, MD 10/06/15 2200

## 2015-10-06 NOTE — ED Notes (Signed)
Pt sent from [facility] [place] for medical clearance and psych eval, lethargic, ?SI and refusing po. H/s CVA with left side paralysis.

## 2015-10-06 NOTE — ED Notes (Signed)
Candise BowensAlice McBride RN from [facility] called, they are concerned about pt and let her come back to the facility. They are worried about about pt and staff.  This week  They have found switch blade, scissors, and nail clipper in bed. Pt has one female visitor that states he did not bring them to her.  Pt is not eating, refusing medication. They put liquid meds in her soda so she will get that.  They have tried buying her food and made special food and pt will not eat.  Pt has outburst and threatens staff and states she does not want to live. Pt has times of confusion.  Facility wants pt to have psy assessment before returning to the facility.  Advised pt has UTI and this could be causing confusion. MD aware

## 2015-10-06 NOTE — ED Notes (Signed)
Pt is alert and oriented on arrival. Pt states she is not suicidal. Pt states, "I told them I wanted to leave this place" (stating that she "meant the [facility] not the world"). Pt is cooperative. NAD.

## 2015-10-06 NOTE — Discharge Instructions (Signed)
Return without fail for worsening symptoms, including fevers, vomiting and unable to keep down food/fluids, confusion/altered mental status, or any other symptoms concerning to you.  Urinary Tract Infection A urinary tract infection (UTI) can occur any place along the urinary tract. The tract includes the kidneys, ureters, bladder, and urethra. A type of germ called bacteria often causes a UTI. UTIs are often helped with antibiotic medicine.  HOME CARE   If given, take antibiotics as told by your doctor. Finish them even if you start to feel better.  Drink enough fluids to keep your pee (urine) clear or pale yellow.  Avoid tea, drinks with caffeine, and bubbly (carbonated) drinks.  Pee often. Avoid holding your pee in for a long time.  Pee before and after having sex (intercourse).  Wipe from front to back after you poop (bowel movement) if you are a woman. Use each tissue only once. GET HELP RIGHT AWAY IF:   You have back pain.  You have lower belly (abdominal) pain.  You have chills.  You feel sick to your stomach (nauseous).  You throw up (vomit).  Your burning or discomfort with peeing does not go away.  You have a fever.  Your symptoms are not better in 3 days. MAKE SURE YOU:   Understand these instructions.  Will watch your condition.  Will get help right away if you are not doing well or get worse.   This information is not intended to replace advice given to you by your health care provider. Make sure you discuss any questions you have with your health care provider.   Document Released: 05/14/2008 Document Revised: 12/17/2014 Document Reviewed: 06/26/2012 Elsevier Interactive Patient Education [employer]2016 [employer].
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
## 2015-10-07 DIAGNOSIS — R45851 Suicidal ideations: Secondary | ICD-10-CM | POA: Diagnosis present

## 2015-10-07 DIAGNOSIS — Z96659 Presence of unspecified artificial knee joint: Secondary | ICD-10-CM | POA: Diagnosis present

## 2015-10-07 DIAGNOSIS — I69354 Hemiplegia and hemiparesis following cerebral infarction affecting left non-dominant side: Secondary | ICD-10-CM | POA: Diagnosis not present

## 2015-10-07 DIAGNOSIS — N3000 Acute cystitis without hematuria: Secondary | ICD-10-CM | POA: Diagnosis present

## 2015-10-07 DIAGNOSIS — Z7401 Bed confinement status: Secondary | ICD-10-CM | POA: Diagnosis not present

## 2015-10-07 DIAGNOSIS — N179 Acute kidney failure, unspecified: Secondary | ICD-10-CM | POA: Diagnosis present

## 2015-10-07 DIAGNOSIS — F29 Unspecified psychosis not due to a substance or known physiological condition: Secondary | ICD-10-CM | POA: Diagnosis not present

## 2015-10-07 DIAGNOSIS — F489 Nonpsychotic mental disorder, unspecified: Secondary | ICD-10-CM | POA: Diagnosis not present

## 2015-10-07 DIAGNOSIS — F17200 Nicotine dependence, unspecified, uncomplicated: Secondary | ICD-10-CM | POA: Diagnosis present

## 2015-10-07 DIAGNOSIS — Z79899 Other long term (current) drug therapy: Secondary | ICD-10-CM | POA: Diagnosis not present

## 2015-10-07 DIAGNOSIS — G9341 Metabolic encephalopathy: Secondary | ICD-10-CM | POA: Diagnosis not present

## 2015-10-07 DIAGNOSIS — I639 Cerebral infarction, unspecified: Secondary | ICD-10-CM | POA: Diagnosis not present

## 2015-10-07 DIAGNOSIS — N390 Urinary tract infection, site not specified: Secondary | ICD-10-CM | POA: Diagnosis not present

## 2015-10-07 DIAGNOSIS — R410 Disorientation, unspecified: Secondary | ICD-10-CM | POA: Diagnosis present

## 2015-10-07 DIAGNOSIS — D539 Nutritional anemia, unspecified: Secondary | ICD-10-CM | POA: Diagnosis present

## 2015-10-07 LAB — BASIC METABOLIC PANEL
Anion gap: 6 (ref 5–15)
BUN: 17 mg/dL (ref 6–20)
CO2: 21 mmol/L — ABNORMAL LOW (ref 22–32)
CREATININE: 1.44 mg/dL — AB (ref 0.44–1.00)
Calcium: 8.4 mg/dL — ABNORMAL LOW (ref 8.9–10.3)
Chloride: 112 mmol/L — ABNORMAL HIGH (ref 101–111)
GFR, EST AFRICAN AMERICAN: 47 mL/min — AB (ref >60)
GFR, EST NON AFRICAN AMERICAN: 41 mL/min — AB (ref >60)
GLUCOSE: 72 mg/dL (ref 65–99)
POTASSIUM: 3.3 mmol/L — AB (ref 3.5–5.1)
SODIUM: 139 mmol/L (ref 135–145)

## 2015-10-07 LAB — CBC
HEMATOCRIT: 32.9 % — AB (ref 36.0–46.0)
Hemoglobin: 10.6 g/dL — ABNORMAL LOW (ref 12.0–15.0)
MCH: 32.9 pg (ref 26.0–34.0)
MCHC: 32.2 g/dL (ref 30.0–36.0)
MCV: 102.2 fL — AB (ref 78.0–100.0)
PLATELETS: 247 10*3/uL (ref 150–400)
RBC: 3.22 MIL/uL — ABNORMAL LOW (ref 3.87–5.11)
RDW: 13.8 % (ref 11.5–15.5)
WBC: 5.5 10*3/uL (ref 4.0–10.5)

## 2015-10-07 NOTE — Progress Notes (Signed)
PROGRESS NOTE  Amy Howe ZOX:[id number] DOB: [birth year] DOA: 10/06/2015 PCP: No primary care provider on file.  Summary: 6 yof PMH CVA with residual left sided hemiparesis presented to the ED after making suicidal threats at her SNF and not acting herself. Per ED note, she has been being treated for a UTI with an unknown abx for an unknown amount of time. While in the ED, UA revealed a possible infection however all other labs were unremarkable. Head CT was negative for any acute disease. She was admitted for further management.   Assessment/Plan: 1. Metabolic encephalopathy, likely related to UTI. Still confused.  2. UTI. Culture pending. Continue abx empirically. 3. AKI. Resolving with IVF.  4. Macrocytic anemia, stable. 5. PMH CVA. Chronic stable left hemiparesis. 6. Social. No apparent SI. Significance of issues charted unclear. Will ask for CSW assistance.     Continue empiric abx and IVF.  Check anemia panel, BMP in AM  Monitor confusion. Plan psychiatry consultation when medically clear. Does not appear to be a danger to self or others  Consult CSW for assistance  Code Status: Full DVT prophylaxis: SCDs Family Communication: none present. Disposition Plan: Plan psychiatry consultation once medically cleared.   [name], MD  [facility]  Pager [phone] If 7PM-7AM, please contact night-coverage at [email or web address], password [facility] 10/07/2015, 7:35 AM    Consultants:    Procedures:    Antibiotics:  Rocephin 10/27>>  HPI/Subjective: Denies any SOB, nausea, or vomiting. Confused.   Objective: Filed Vitals:   10/06/15 2200 10/06/15 2245 10/06/15 2342 10/07/15 0300  BP: 87/63 98/68 100/81 148/91  Pulse: 78 107 78 78  Temp:   98.2 F (36.8 C) 98.5 F (36.9 C)  TempSrc:   Oral Oral  Resp: Height:    (1.727 m)   Weight:   81.647 kg (180 lb)   SpO2: 100% 100% 100% 100%    Intake/Output Summary (Last 24 hours) at 10/07/15  0735 Last data filed at 10/07/15 0300  Gross per 24 hour  Intake      0 ml  Output      2 ml  Net     -2 ml     Filed Weights   10/06/15 1436 10/06/15 1447 10/06/15 2342  Weight: 63.504 kg (140 lb) 63.504 kg (140 lb) 81.647 kg (180 lb)    Exam:     VSS, afebrile, not hypoxic General:  Appears calm and comfortable Eyes: PERRL, normal lids, irises   ENT: grossly normal hearing, lips & tongue Cardiovascular: RRR, no m/r/g. No LE edema. Respiratory: CTA bilaterally, no w/r/r. Normal respiratory effort. Abdomen: soft, ntnd Skin: no rash or induration noted Musculoskeletal: Left upper and lower extremity paresis.  Psychiatric: Speech fluent but inappropriate, confused. Oriented to person but believes it is February,   New data reviewed:  Creatinine improving 1.44, Potassium 3.3  Hgb stable 10.6    Pertinent data since admission:  EKG SR  CXR NAD  Pending data:  UC  Scheduled Meds: . cefTRIAXone (ROCEPHIN)  IV  1 g Intravenous Q24H   Continuous Infusions: . sodium chloride 50 mL/hr at 10/06/15 2320    Principal Problem:   Metabolic encephalopathy Active Problems:   UTI (lower urinary tract infection)   Stroke (HCC)   Suicidal behavior   Time spent 20 minutes   By signing my name below, I, Burnett Harry attest that this documentation has been prepared under the direction and in the presence of [name],  MD Electronically signed: Burnett HarryJennifer Gregorio, Scribe. 10/07/2015 11:51am  I personally performed the services described in this documentation. All medical record entries made by the scribe were at my direction. I have reviewed the chart and agree that the record reflects my personal performance and is accurate and complete. Brendia Sacksaniel Acelin Ferdig, MD

## 2015-10-07 NOTE — Care Management Important Message (Signed)
Important Message  Patient Details  Name: Amy MuscaRobin L Howe MRN: [id number] Date of Birth: 01/14/1961   Medicare Important Message Given:  Yes-second notification given    Malcolm MetroChildress, Cung Masterson Demske, RN 10/07/2015, 4:09 PM
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
## 2015-10-07 NOTE — Care Management Note (Signed)
Case Management Note  Patient Details  Name: Amy MuscaRobin L Howe MRN: [id number] Date of Birth: 05/31/1961  Subjective/Objective:                  [facility] is from [facility].   Action/Plan: Anticipate pt will return to SNF at [place]. CSW is aware and will work with pt placement at [place]. No CM needs at this time.    Expected Discharge Date:     10/10/2015             Expected Discharge Plan:  Skilled Nursing Facility  In-House Referral:  Clinical Social Work  Discharge planning Services  CM Consult  Post Acute Care Choice:  NA Choice offered to:  NA  DME Arranged:    DME Agency:     HH Arranged:    HH Agency:     Status of Service:  Completed, signed off  Medicare Important Message Given:    Date Medicare IM Given:    Medicare IM give by:    Date Additional Medicare IM Given:    Additional Medicare Important Message give by:     If discussed at Long Length of Stay Meetings, dates discussed:    Additional Comments:  Malcolm MetroChildress, Aryel Edelen Demske, RN 10/07/2015, 3:13 PM

## 2015-10-08 LAB — CBC
HCT: 35.2 % — ABNORMAL LOW (ref 36.0–46.0)
HEMOGLOBIN: 11.3 g/dL — AB (ref 12.0–15.0)
MCH: 32.8 pg (ref 26.0–34.0)
MCHC: 32.1 g/dL (ref 30.0–36.0)
MCV: 102 fL — ABNORMAL HIGH (ref 78.0–100.0)
PLATELETS: 263 10*3/uL (ref 150–400)
RBC: 3.45 MIL/uL — AB (ref 3.87–5.11)
RDW: 13.9 % (ref 11.5–15.5)
WBC: 6.2 10*3/uL (ref 4.0–10.5)

## 2015-10-08 LAB — BASIC METABOLIC PANEL
Anion gap: 8 (ref 5–15)
BUN: 14 mg/dL (ref 6–20)
CALCIUM: 8.5 mg/dL — AB (ref 8.9–10.3)
CO2: 20 mmol/L — AB (ref 22–32)
CREATININE: 1.29 mg/dL — AB (ref 0.44–1.00)
Chloride: 111 mmol/L (ref 101–111)
GFR calc Af Amer: 54 mL/min — ABNORMAL LOW (ref >60)
GFR, EST NON AFRICAN AMERICAN: 46 mL/min — AB (ref >60)
GLUCOSE: 93 mg/dL (ref 65–99)
Potassium: 3.3 mmol/L — ABNORMAL LOW (ref 3.5–5.1)
Sodium: 139 mmol/L (ref 135–145)

## 2015-10-08 LAB — URINE CULTURE: Culture: NO GROWTH

## 2015-10-08 LAB — PROCALCITONIN: PROCALCITONIN: 0.47 ng/mL

## 2015-10-08 MED ORDER — CITALOPRAM HYDROBROMIDE 20 MG PO TABS
20.0000 mg | ORAL_TABLET | Freq: Every day | ORAL | Status: DC
  Administered 2015-10-08 – 2015-10-14 (×6): 20 mg via ORAL
  Filled 2015-10-08 (×7): qty 1

## 2015-10-08 MED ORDER — LEVOTHYROXINE SODIUM 25 MCG PO TABS
25.0000 ug | ORAL_TABLET | Freq: Every day | ORAL | Status: DC
  Administered 2015-10-09 – 2015-10-14 (×5): 25 ug via ORAL
  Filled 2015-10-08 (×6): qty 1

## 2015-10-08 MED ORDER — DIVALPROEX SODIUM ER 250 MG PO TB24
250.0000 mg | ORAL_TABLET | Freq: Every day | ORAL | Status: DC
  Administered 2015-10-08 – 2015-10-14 (×6): 250 mg via ORAL
  Filled 2015-10-08 (×10): qty 1

## 2015-10-08 MED ORDER — POTASSIUM CHLORIDE CRYS ER 20 MEQ PO TBCR
40.0000 meq | EXTENDED_RELEASE_TABLET | ORAL | Status: AC
  Administered 2015-10-08 (×2): 40 meq via ORAL
  Filled 2015-10-08 (×2): qty 2

## 2015-10-08 MED ORDER — DIVALPROEX SODIUM 250 MG PO DR TAB
375.0000 mg | DELAYED_RELEASE_TABLET | Freq: Every day | ORAL | Status: DC
  Administered 2015-10-08 – 2015-10-13 (×6): 375 mg via ORAL
  Filled 2015-10-08 (×9): qty 1

## 2015-10-08 MED ORDER — CLONAZEPAM 0.5 MG PO TABS
0.5000 mg | ORAL_TABLET | Freq: Three times a day (TID) | ORAL | Status: DC
  Administered 2015-10-08 – 2015-10-09 (×4): 0.5 mg via ORAL
  Filled 2015-10-08 (×4): qty 1

## 2015-10-08 MED ORDER — HYDROCODONE-ACETAMINOPHEN 10-325 MG PO TABS
1.0000 | ORAL_TABLET | Freq: Once | ORAL | Status: AC
  Administered 2015-10-08: 1 via ORAL
  Filled 2015-10-08: qty 1

## 2015-10-08 MED ORDER — MIRTAZAPINE 15 MG PO TABS: 15.0000 mg | ORAL_TABLET | Freq: Every day | ORAL | Status: DC

## 2015-10-08 NOTE — BH Assessment (Signed)
Tele Assessment Note   Amy Howe is an 54 y.o. female. Pt presents voluntarily to [facility] from [facility]. Per chart review, pt has hx of CVA. She has lived at [facility] for 19 mos. Per chart review, pt has been endorsing SI and hiding a pocketknife, scissors and nail clippers in her bed. Pt is oriented to self, date and place. She isn't oriented to situation. Pt's speech is coherent. She is delusional. She tells Clinical research associate that she delivered a "baby with red hair" yesterday, and pt's husband of 30 yrs took the baby. When Clinical research associate asks re: pt's mood, pt says, "I'd be a whole lot better if I had a cigarette and I want to see my baby." Pt reports insomnia. She reports she has lost 100 lbs in the past one month. Pt denies SI and HI. She denies hx of suicide attempts. Writer asks pt about pocketknife and other sharps pt had in her bed. Pt says that the knife was a "penknife my Dad gave me". She says she has had the penknife since childhood. Pt denies hx of inpt psych treatment. Pt reports she has seen an outpatient provider several years ago for antidepressants and antianxiety meds. Pt didn't know name of provider. She reports her two daughter are busy with their own lives, and they don't visit her. She reports she doesn't want to kill herself, but [facility].    Diagnosis: Neurocognitive Disorder                   Unspecified Depressive Disorder  Past Medical History:  Past Medical History  Diagnosis Date  . Stroke [facility])     Past Surgical History  Procedure Laterality Date  . Tonsillectomy    . Replacement total knee    . Shoulder surgery    . Abdominal hysterectomy      Family History: History reviewed. No pertinent family history.  Social History:  reports that she has been smoking.  She does not have any smokeless tobacco history on file. She reports that she does not drink alcohol or use illicit drugs.  Additional Social History:  Alcohol /  Drug Use Pain Medications: pt denies abuse Prescriptions: pt denies abuse  Over the Counter: pt denies abuse History of alcohol / drug use?: No history of alcohol / drug abuse  CIWA: CIWA-Ar BP: (!) 104/57 mmHg Pulse Rate: 84 COWS:    PATIENT STRENGTHS: (choose at least two) Average or above average intelligence Communication skills  Allergies: No Known Allergies  Home Medications:  Medications Prior to Admission  Medication Sig Dispense Refill  . buPROPion (WELLBUTRIN SR) 150 MG 12 hr tablet Take 150 mg by mouth every morning.    . calcium-vitamin D (OSCAL WITH D) 500-200 MG-UNIT tablet Take 1 tablet by mouth 2 (two) times daily.    . citalopram (CELEXA) 20 MG tablet Take 20 mg by mouth daily.    . clonazePAM (KLONOPIN) 0.5 MG tablet Take 0.5 mg by mouth 3 (three) times daily.    . divalproex (DEPAKOTE ER) 250 MG 24 hr tablet Take 250 mg by mouth daily.    . divalproex (DEPAKOTE) 125 MG DR tablet Take 375 mg by mouth at bedtime.    . docusate sodium (COLACE) 100 MG capsule Take 100 mg by mouth 2 (two) times daily.    . ertapenem [facility]) 1 G injection Inject 1 g into the muscle daily.    Marland Kitchen  Fe Fum-FA-B Cmp-C-Zn-Mg-Mn-Cu (HEMOCYTE PLUS) 106-1 MG CAPS Take 1 capsule by mouth daily.    . ferrous sulfate (FERROUSUL) 325 (65 FE) MG tablet Take 325 mg by mouth daily with breakfast.    . gabapentin (NEURONTIN) 100 MG capsule Take 100 mg by mouth 3 (three) times daily.    Marland Kitchen. HYDROcodone-acetaminophen (NORCO) 10-325 MG tablet Take 1 tablet by mouth 2 (two) times daily.    Marland Kitchen. levothyroxine (SYNTHROID, LEVOTHROID) 25 MCG tablet Take 25 mcg by mouth daily before breakfast.    . mirtazapine (REMERON) 15 MG tablet Take 15 mg by mouth at bedtime.    Marland Kitchen. omeprazole (PRILOSEC) 20 MG capsule Take 20 mg by mouth daily.    . OXYGEN Inhale 2 L into the lungs every 8 (eight) hours as needed (for shortness of breath).    . potassium chloride (K-DUR) 10 MEQ tablet Take 10 mEq by mouth daily.    .  risperiDONE (RISPERDAL) 0.25 MG tablet Take 0.25 mg by mouth every morning.    . senna (SENOKOT) 8.6 MG tablet Take 1 tablet by mouth daily.    . traZODone (DESYREL) 50 MG tablet Take 50 mg by mouth at bedtime.      OB/GYN Status:  No LMP recorded. Patient has had a hysterectomy.  General Assessment Data Location of Assessment: [facility] TTS Assessment: In system Is this a Tele or Face-to-Face Assessment?: Tele Assessment Is this an Initial Assessment or a Re-assessment for this encounter?: Initial Assessment Marital status: Married Is patient pregnant?: No Pregnancy Status: No Living Arrangements:  ([facility]) Can pt return to current living arrangement?: Yes Admission Status: Voluntary Is patient capable of signing voluntary admission?: Yes Referral Source:  (SNF)     Crisis Care Plan Living Arrangements:  ([facility]) Name of Psychiatrist: none Name of Therapist: none  Education Status Is patient currently in school?: No  Risk to self with the past 6 months Suicidal Ideation: No Has patient been a risk to self within the past 6 months prior to admission? : No Suicidal Intent: No Has patient had any suicidal intent within the past 6 months prior to admission? : No Is patient at risk for suicide?: No Suicidal Plan?: No Has patient had any suicidal plan within the past 6 months prior to admission? : No Access to Means: No What has been your use of drugs/alcohol within the last 12 months?: pt denies use Previous Attempts/Gestures: No How many times?: 0 Other Self Harm Risks: none Triggers for Past Attempts:  (n/a) Intentional Self Injurious Behavior: None Family Suicide History: No (parents were both alcoholics) Recent stressful life event(s): Other (Comment) (living in SNF) Persecutory voices/beliefs?: No Depression: No Depression Symptoms: Insomnia Substance abuse history and/or treatment for substance abuse?: No Suicide prevention information given to  non-admitted patients: Not applicable  Risk to Others within the past 6 months Homicidal Ideation: No Does patient have any lifetime risk of violence toward others beyond the six months prior to admission? : No Thoughts of Harm to Others: No Current Homicidal Intent: No Current Homicidal Plan: No Access to Homicidal Means: No Identified Victim: none History of harm to others?: No Assessment of Violence: None Noted Violent Behavior Description: pt denies hx violence Does patient have access to weapons?: No Criminal Charges Pending?: No Does patient have a court date: No Is patient on probation?: No  Psychosis Hallucinations: None noted Delusions: Somatic (think she delivered baby yesterday)  Mental Status Report Appearance/Hygiene: Unremarkable, In hospital gown Eye  Contact: Good Motor Activity: Freedom of movement Speech: Logical/coherent Level of Consciousness: Alert, Quiet/awake Mood: Euthymic Affect: Appropriate to circumstance, Blunted Anxiety Level: Minimal Thought Processes: Relevant, Coherent, Circumstantial, Irrelevant Judgement: Impaired Orientation: Person, Place, Time Obsessive Compulsive Thoughts/Behaviors: None  Cognitive Functioning Concentration: Normal Memory: Recent Intact, Remote Intact IQ: Average Insight: Poor Impulse Control: Fair Appetite: Poor Weight Loss:  (pt sts lost 100 lbs in ONE month) Sleep: Decreased Total Hours of Sleep: 2 Vegetative Symptoms: None  ADLScreening [facility] Assessment Services) Patient's cognitive ability adequate to safely complete daily activities?: Yes Patient able to express need for assistance with ADLs?: Yes Independently performs ADLs?: No  Prior Inpatient Therapy Prior Inpatient Therapy: No Prior Therapy Dates: na Prior Therapy Facilty/Provider(s): na Reason for Treatment: na  Prior Outpatient Therapy Prior Outpatient Therapy: Yes Prior Therapy Dates: several yrs ago Prior Therapy Facilty/Provider(s): pt  sts saw therapist and was Rx antidepressants Does patient have an ACCT team?: No Does patient have Intensive In-House Services?  : No Does patient have [facility] services? : No Does patient have P4CC services?: No  ADL Screening (condition at time of admission) Patient's cognitive ability adequate to safely complete daily activities?: Yes Is the patient deaf or have difficulty hearing?: No Does the patient have difficulty seeing, even when wearing glasses/contacts?: No Does the patient have difficulty concentrating, remembering, or making decisions?: Yes Patient able to express need for assistance with ADLs?: Yes Does the patient have difficulty dressing or bathing?: Yes Independently performs ADLs?: No Communication: Independent Dressing (OT): Independent with device (comment) Is this a change from baseline?: Pre-admission baseline Grooming: Appropriate for developmental age Is this a change from baseline?: Pre-admission baseline Feeding: Independent Bathing: Needs assistance Is this a change from baseline?: Pre-admission baseline Toileting: Independent with device (comment) Is this a change from baseline?: Pre-admission baseline In/Out Bed: Appropriate for developmental age Is this a change from baseline?: Pre-admission baseline Walks in Home: Independent with device (comment) Is this a change from baseline?: Pre-admission baseline Does the patient have difficulty walking or climbing stairs?: Yes Weakness of Legs: Left Weakness of Arms/Hands: Left  Home Assistive Devices/Equipment Home Assistive Devices/Equipment: Wheelchair, Environmental consultant (specify type)  Therapy Consults (therapy consults require a physician order) PT Evaluation Needed: No OT Evalulation Needed: No SLP Evaluation Needed: No Abuse/Neglect Assessment (Assessment to be complete while patient is alone) Physical Abuse: Denies Verbal Abuse: Denies Sexual Abuse: Denies Exploitation of patient/patient's resources:  Denies Self-Neglect: Denies Values / Beliefs Cultural Requests During Hospitalization: None Spiritual Requests During Hospitalization: None Consults Spiritual Care Consult Needed: No Social Work Consult Needed: No Merchant navy officer (For Healthcare) Does patient have an advance directive?: Yes Type of Advance Directive: Out of facility DNR (pink MOST or yellow form) Does patient want to make changes to advanced directive?: No - Patient declined Copy of advanced directive(s) in chart?: Yes Nutrition Screen- [facility] Adult/[facility] Patient's home diet: Regular Has the patient recently lost weight without trying?: No Has the patient been eating poorly because of a decreased appetite?: No Malnutrition Screening Tool Score: 0  Additional Information 1:1 In Past 12 Months?: No CIRT Risk: No Elopement Risk: No Does patient have medical clearance?: Yes     Disposition:  Disposition Initial Assessment Completed for this Encounter: Yes Disposition of Patient: Inpatient treatment program Type of inpatient treatment program:  (may agustin NP rec geropsych placement)  Sujey Gundry P 10/08/2015 1:56 PM

## 2015-10-08 NOTE — BHH Counselor (Signed)
Other TTS therapist is conducting TA w/ pt in [facility] so TA machine not currently available.  Evette Cristalaroline Paige Jazsmin Couse, [place] Therapeutic Triage Specialist

## 2015-10-08 NOTE — Progress Notes (Signed)
Patient has been confused for duration of shift. Patient has only been oriented to self.  Patient not aware she is in the hopsital, and when oriented will not accept she is in the hospital.  Patient believes she has recently had a baby and has a very volatile personality.  At times patient is cooperative, other times patient is very verbally aggressive.

## 2015-10-08 NOTE — BHH Counselor (Addendum)
Pt's RN to remove and reorder TTS consult. New TTS consult was entered. Per Evangeline GulaKaren RN, ED reports they are unable to move TA machine currently d/t pt load in ED.   Evette Cristalaroline Paige Valerio Pinard, [place] Therapeutic Triage Specialist [phone]   Writer spoke w/ pt's RN Ty HiltsKaren Woods, [phone]. RN will place teleassessment machine in pt's room so writer can conduct TA. Writer reviewed pt's clinical info.  Evette Cristalaroline Paige Stephanie Littman, [place] Therapeutic Triage Specialist [phone]

## 2015-10-08 NOTE — Progress Notes (Signed)
Disposition CSW completed patient referrals to the following Geri-Psych facilities:  [facility]  CSW will continue to follow patient for placement needs.  Seward SpeckLeo Alyx Gee [facility]CCSW,LCAS Behavioral Health Disposition CSW [phone]

## 2015-10-08 NOTE — Progress Notes (Signed)
PROGRESS NOTE  Amy Howe:[id number] DOB: 02-10-1961 DOA: 10/06/2015 PCP: No primary care provider on file.  Summary: 3 yof PMH CVA with residual left sided hemiparesis presented to the ED after making suicidal threats at her SNF and not acting herself. Per ED note, she has been being treated for a UTI with an unknown abx for an unknown amount of time. While in the ED, UA revealed a possible infection however all other labs were unremarkable. Head CT was negative for any acute disease. She was admitted for further management.   Assessment/Plan: 1. Metabolic encephalopathy, suspect multifactorial including UTI; suspect driving force is psychiatric. Still confused.  2. Suspected underlying mental illness. Outpatient meds include Klonopin, Celexa, buproprion, divalproex, mirtazapine, risperidone, trazodone.  3. UTI. Culture pending. Continue abx empirically. Afebrile without leukocytosis.  4. AKI. Resolving with IVF.  5. Macrocytic anemia, stable. 6. PMH CVA. Chronic stable left hemiparesis. 7. Social. No SI. Significance of issues charted unclear. CSW following.    Overall improved. Alert, follows simple commands. Doubt her confusion at this point can be explained by a UTI. She is on multiple psych meds. No hx in Epic to explain. Suspect would benefit from inpt psych.  Continue empiric abx and IVF.  Plan psychiatry consultation today. Reportedly had threats of SI at SNF although patient denies. Found to have several knives in her bed. Does not appear to be a danger to self or others at this point.   Code Status: Full DVT prophylaxis: SCDs Family Communication: Discussed with patient who understands and has no concerns at this time. Disposition Plan: Plan psychiatry consultation once medically cleared.   [name], MD  [facility]  Pager [phone] If 7PM-7AM, please contact night-coverage at [email or web address], password [email or web address] 10/08/2015, 7:35 AM  LOS: 1 day    Consultants:    Procedures:    Antibiotics:  Rocephin 10/27>>  HPI/Subjective: Believes she had a baby yesterday and is home in a new house she just bought. Denies any SOB or nausea. Complains of a HA and back pain.   Confused per RN overnight.  Objective: Filed Vitals:   10/07/15 0300 10/07/15 1441 10/07/15 2300 10/08/15 0400  BP: 148/91 96/54 92/53  104/57  Pulse: 78 76 109 84  Temp: 98.5 F (36.9 C) 97.9 F (36.6 C) 98.5 F (36.9 C) 98.7 F (37.1 C)  TempSrc: Oral Oral Oral Oral  Resp: Height:      Weight:    82.101 kg (181 lb)  SpO2: 100% 100% 100% 96%    Intake/Output Summary (Last 24 hours) at 10/08/15 0735 Last data filed at 10/07/15 1717  Gross per 24 hour  Intake    750 ml  Output      0 ml  Net    750 ml     Filed Weights   10/06/15 1447 10/06/15 2342 10/08/15 0400  Weight: 63.504 kg (140 lb) 81.647 kg (180 lb) 82.101 kg (181 lb)    Exam:     VSS, afebrile, not hypoxic General:  Appears calm and comfortable Eyes: PERRL, normal lids, irises   ENT: grossly normal hearing, lips & tongue Cardiovascular: RRR, no m/r/g. No LE edema. Respiratory: CTA bilaterally, no w/r/r. Normal respiratory effort. Abdomen: soft, ntnd Skin: large blister on right heel, presume present on admission Musculoskeletal: Left upper and lower extremity paresis. Right upper and lower extremity tone and strength normal.  Psychiatric: Speech fluent but inappropriate, confused. Oriented to person but believes she had a baby yesterday  and she is home.   New data reviewed:  none  Pertinent data since admission:  EKG SR  CXR NAD  Pending data:  UC  Scheduled Meds: . cefTRIAXone (ROCEPHIN)  IV  1 g Intravenous Q24H   Continuous Infusions:    Principal Problem:   Metabolic encephalopathy Active Problems:   UTI (lower urinary tract infection)   Stroke (HCC)   Suicidal behavior   Time spent 20 minutes   By signing my name below, I, Burnett HarryJennifer  Gregorio attest that this documentation has been prepared under the direction and in the presence of Brendia Sacksaniel Zakirah Weingart, MD Electronically signed: Burnett HarryJennifer Gregorio, Scribe. 10/08/2015 9:14am  I personally performed the services described in this documentation. All medical record entries made by the scribe were at my direction. I have reviewed the chart and agree that the record reflects my personal performance and is accurate and complete. Brendia Sacksaniel Topacio Cella, MD

## 2015-10-09 MED ORDER — CLONAZEPAM 0.5 MG PO TABS
0.5000 mg | ORAL_TABLET | Freq: Two times a day (BID) | ORAL | Status: DC | PRN
Start: 2015-10-09 — End: 2015-10-14

## 2015-10-09 MED ORDER — CEFUROXIME AXETIL 250 MG PO TABS
500.0000 mg | ORAL_TABLET | Freq: Two times a day (BID) | ORAL | Status: DC
  Administered 2015-10-09 – 2015-10-11 (×5): 500 mg via ORAL
  Filled 2015-10-09 (×6): qty 2

## 2015-10-09 MED ORDER — CEFUROXIME AXETIL 500 MG PO TABS: 500.0000 mg | ORAL_TABLET | Freq: Two times a day (BID) | ORAL | Status: DC

## 2015-10-09 MED ORDER — HYDROCODONE-ACETAMINOPHEN 5-325 MG PO TABS
1.0000 | ORAL_TABLET | Freq: Once | ORAL | Status: AC
  Administered 2015-10-10: 1 via ORAL
  Filled 2015-10-09: qty 1

## 2015-10-09 MED ORDER — CLONAZEPAM 0.5 MG PO TABS
0.5000 mg | ORAL_TABLET | Freq: Two times a day (BID) | ORAL | Status: DC | PRN
  Administered 2015-10-09 – 2015-10-10 (×3): 0.5 mg via ORAL
  Filled 2015-10-09 (×3): qty 1

## 2015-10-09 NOTE — Progress Notes (Signed)
PROGRESS NOTE  Amy Howe HYQ:[id number] DOB: 12/15/1960 DOA: 10/06/2015 PCP: No primary care provider on file.  Summary: [age] yof PMH CVA with residual left sided hemiparesis presented to the ED after making suicidal threats at her SNF and not acting herself. Per ED note, she has been being treated for a UTI with an unknown abx for an unknown amount of time. While in the ED, UA revealed a possible infection however all other labs were unremarkable. Head CT was negative for any acute disease. She was admitted for further management.   Assessment/Plan: 1. Metabolic encephalopathy, suspect driving force is psychiatric. Appears stable.    2. Suspected underlying mental illness with acute exacerbation, delusions. Outpatient meds include Klonopin, Celexa, buproprion, divalproex, mirtazapine, risperidone, trazodone. Psychiatry recommends inpatient treatment 3. UTI culture negative; however was on abx prior to admission. Switch to oral abx. Remains afebrile without leukocytosis.  4. AKI. Resolved with IVF.  5. Macrocytic anemia, stable. 6. PMH CVA. Chronic stable left hemiparesis. 7. Social. No SI. Significance of issues charted unclear. CSW following.    Appears stable, change to oral abx, and add low dose ASA.  Agree with transfer to geri-psych when bed available.  Patient is medically clear.  Code Status: Full DVT prophylaxis: SCDs Family Communication: Discussed with patient who understands and has no concerns at this time. Disposition Plan: Transfer to geri-psych once bed is available.   Brendia Sacksaniel Goodrich, MD  [facility]  Pager [phone] If 7PM-7AM, please contact night-coverage at [email or web address], password [email or web address] 10/09/2015, 7:05 AM  LOS: 2 days   Consultants:    Procedures:    Antibiotics:  Rocephin 10/27>>  HPI/Subjective: Does not feel well. Nausea prevents eating. Denies any pain.  Objective: Filed Vitals:   10/08/15 2234 10/09/15 0045 10/09/15 0500 10/09/15  0642  BP: 97/60   132/55  Pulse: 87   82  Temp: 98.9 F (37.2 C)   98 F (36.7 C)  TempSrc: Oral   Oral  Resp: 20   20  Height:      Weight:  83.099 kg (183 lb 3.2 oz) 83.099 kg (183 lb 3.2 oz)   SpO2: 100%   100%    Intake/Output Summary (Last 24 hours) at 10/09/15 0705 Last data filed at 10/08/15 1900  Gross per 24 hour  Intake    600 ml  Output      0 ml  Net    600 ml     Filed Weights   10/08/15 0400 10/09/15 0045 10/09/15 0500  Weight: 82.101 kg (181 lb) 83.099 kg (183 lb 3.2 oz) 83.099 kg (183 lb 3.2 oz)    Exam:     VSS, afebrile, not hypoxic General:  Appears comfortable, calm. Eyes: PERRL, normal lids, irises Cardiovascular: Regular rate and rhythm, no murmur, rub or gallop. No lower extremity edema. Respiratory: Clear to auscultation bilaterally, no wheezes, rales or rhonchi. Normal respiratory effort. Abdomen: soft, ntnd Musculoskeletal:Move RUE and RLE to commands.   New data reviewed:  Urine culture no growth  Pertinent data since admission:  EKG SR  CXR NAD  Pending data:    Scheduled Meds: . cefTRIAXone (ROCEPHIN)  IV  1 g Intravenous Q24H  . citalopram  20 mg Oral Daily  . clonazePAM  0.5 mg Oral TID  . divalproex  250 mg Oral Daily  . divalproex  375 mg Oral QHS  . levothyroxine  25 mcg Oral QAC breakfast   Continuous Infusions:    Principal Problem:   Metabolic encephalopathy Active  Problems:   UTI (lower urinary tract infection)   Stroke (HCC)   Suicidal behavior   Time spent 20 minutes    By signing my name below, I, Zadie Cleverly attest that this documentation has been prepared under the direction and in the presence of [name], MD Electronically signed: Zadie Cleverly  10/09/2015 11:59am  I personally performed the services described in this documentation. All medical record entries made by the scribe were at my direction. I have reviewed the chart and agree that the record reflects my personal  performance and is accurate and complete. [name], MD

## 2015-10-09 NOTE — Discharge Summary (Signed)
Physician Discharge Summary  Amy Howe:[id number] DOB: 18-Sep-1961 DOA: 10/06/2015  PCP: No primary care provider on file.  Admit date: 10/06/2015 Discharge date: 10/09/2015  Transfer to geriatric psychiatric facility  Consider antiplatelet therapy as outpatient for secondary risk reduction for stroke if no contraindication.  Discharge Diagnoses:  1. Metabolic encephalopathy 2. Suspected underlying mental illness with active delusions 3. UTI.  4. AKI. 5. Macrocytic anemia. 6. PMH CVA.   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 Per ED note, she has been being treated for a UTI with an unknown abx for an unknown amount of time. While in the ED, UA revealed a possible infection however all other labs were unremarkable. Head CT was negative for any acute disease. She was admitted for further management.  Hospital Course:  Etiology of metabolic encephalopathy suspected to be multifactorial including UTI, yet greatest driving force being psychiatric. Suspect there is an underlying mental illness due to outpatient medications that included: Klonopin, Celexa, buproprion, divalproex, mirtazapine, risperidone, trazodone.  UTI was treated with empiric abx. Urine culture was negative but patient was on abx prior to admission. AKI resolved with IVF. When medically stable, psychiatry was consulted and recommended inpatient admission to geriatric psychiatric facility.  Individual issues as below:  1. Metabolic encephalopathy, suspect driving force is psychiatric. Appears stable.  2. Suspected underlying  mental illness with acute exacerbation, delusions. Outpatient meds include Klonopin, Celexa, buproprion, divalproex, mirtazapine, risperidone, trazodone. Psychiatry recommends inpatient treatment 3. UTI culture negative; however was on abx prior to admission. Switch to oral abx. Remains afebrile without leukocytosis.  4. AKI. Resolved with IVF.  5. Macrocytic anemia, stable. 6. PMH CVA. Chronic stable left hemiparesis. 7. Social. No SI. Significance of issues charted unclear. CSW following.  Consultants: 8. None  Procedures:  None  Antibiotics:  Rocephin 10/27>> 10/29  Ceftin 10/30 >> 11/2  Discharge Instructions   Current Discharge Medication List    START taking these medications   Details  cefUROXime (CEFTIN) 500 MG tablet Take 1 tablet (500 mg total) by mouth 2 (two) times daily with a meal. Last dose 11/2.      CONTINUE these medications which have CHANGED   Details  clonazePAM (KLONOPIN) 0.5 MG tablet Take 1 tablet (0.5 mg total) by mouth 2 (two) times daily as needed (anxiety).      CONTINUE these medications which have NOT CHANGED   Details  calcium-vitamin D (OSCAL WITH D) 500-200 MG-UNIT tablet Take 1 tablet by mouth 2 (two) times daily.    citalopram (CELEXA) 20 MG tablet Take 20 mg by mouth daily.    divalproex (DEPAKOTE ER) 250 MG 24 hr tablet Take 250 mg by mouth daily.    divalproex (DEPAKOTE) 125 MG DR tablet Take 375 mg by mouth at bedtime.    docusate sodium (COLACE) 100 MG capsule Take 100 mg by mouth 2 (two) times daily.    Fe Fum-FA-B Cmp-C-Zn-Mg-Mn-Cu (HEMOCYTE PLUS) 106-1 MG CAPS Take 1 capsule by mouth daily.    gabapentin (NEURONTIN) 100 MG capsule Take 100 mg by mouth 3 (three) times daily.  levothyroxine (SYNTHROID, LEVOTHROID) 25 MCG tablet Take 25 mcg by mouth daily before breakfast.    omeprazole (PRILOSEC) 20 MG capsule Take 20 mg by mouth daily.    OXYGEN Inhale 2 L into the lungs every 8 (eight) hours as needed (for shortness  of breath).    potassium chloride (K-DUR) 10 MEQ tablet Take 10 mEq by mouth daily.    senna (SENOKOT) 8.6 MG tablet Take 1 tablet by mouth daily.      STOP taking these medications     buPROPion (WELLBUTRIN SR) 150 MG 12 hr tablet      ertapenem (INVANZ) 1 G injection      ferrous sulfate (FERROUSUL) 325 (65 FE) MG tablet      HYDROcodone-acetaminophen (NORCO) 10-325 MG tablet      mirtazapine (REMERON) 15 MG tablet      risperiDONE (RISPERDAL) 0.25 MG tablet      traZODone (DESYREL) 50 MG tablet        No Known Allergies  The results of significant diagnostics from this hospitalization (including imaging, microbiology, ancillary and laboratory) are listed below for reference.    Significant Diagnostic Studies: Dg Chest 1 View  10/06/2015  CLINICAL DATA:  Confusion, lethargy. EXAM: CHEST 1 VIEW COMPARISON:  November 14, 2014. FINDINGS: The heart size and mediastinal contours are within normal limits. Both lungs are clear. No pneumothorax or pleural effusion is noted. The visualized skeletal structures are unremarkable. IMPRESSION: No active disease. Electronically Signed   By: Lupita RaiderJames  Green Jr, M.D.   On: 10/06/2015 17:14   Ct Head Wo Contrast  10/06/2015  CLINICAL DATA:  Patient presents with lethargy. History of a stroke and left-sided hemiplegia. Over the course of the last few weeks patient has had a decreased appetite and generalized weakness. EXAM: CT HEAD WITHOUT CONTRAST TECHNIQUE: Contiguous axial images were obtained from the base of the skull through the vertex without intravenous contrast. COMPARISON:  05/12/2005 FINDINGS: The ventricles are normal configuration. There is ventricular and sulcal enlargement reflecting moderate atrophy, advanced for age. No hydrocephalus. There is an old right-sided infarct. Infarct involves the right base a ganglia extending to the deep right frontal lobe periventricular white matter. There is hypoattenuation in the white matter of  frontal lobe adjacent to the infarct likely due to chronic microvascular ischemic change. This is new from the prior study. There is associated ex vacuo dilation of the right lateral ventricle. There are no parenchymal masses or mass effect. There is no evidence of a recent cortical infarct. There are no extra-axial masses or abnormal fluid collections. There is no intracranial hemorrhage. The visualized sinuses and mastoid air cells are clear. No skull lesion. IMPRESSION: 1. No acute intracranial abnormalities. 2. Old right-sided infarct. The infarct is similar to the prior CT. New hypoattenuation throughout much of the right frontal lobe has developed since the prior study likely due to chronic microvascular ischemic change. 3. Moderate atrophy advanced for age and increased when compared to the prior CT. Electronically Signed   By: Amie Portlandavid  Ormond M.D.   On: 10/06/2015 16:56    Microbiology: Recent Results (from the past 240 hour(s))  Urine culture     Status: None   Collection Time: 10/06/15  6:06 PM  Result Value Ref Range Status   Specimen Description URINE, CATHETERIZED  Final   Special Requests NONE  Final   Culture   Final    NO GROWTH 1 DAY [facility]    Report Status 10/08/2015 FINAL  Final     Labs: Basic Metabolic Panel:  Recent Labs Lab 10/06/15 1521 10/07/15 0550 10/08/15 0840  NA 138 139 139  K 4.3 3.3* 3.3*  CL 107 112* 111  CO2 23 21* 20*  GLUCOSE 73 72 93  BUN CREATININE 1.69* 1.44* 1.29*  CALCIUM 9.2 8.4* 8.5*   Liver Function Tests:  Recent Labs Lab 10/06/15 1521  AST 34  ALT 15  ALKPHOS 251*  BILITOT 0.6  PROT 5.1*  ALBUMIN 1.7*    CBC:  Recent Labs Lab 10/06/15 1521 10/07/15 0550 10/08/15 0840  WBC 6.2 5.5 6.2  NEUTROABS 3.6  --   --   HGB 11.2* 10.6* 11.3*  HCT 35.1* 32.9* 35.2*  MCV 103.2* 102.2* 102.0*  PLT 263 247 263    CBG:  Recent Labs Lab 10/06/15 1547  GLUCAP 73    Principal Problem:    Metabolic encephalopathy Active Problems:   UTI (lower urinary tract infection)   Stroke (HCC)   Suicidal behavior   Time coordinating discharge: 35 minutes   Signed:  [name], MD [facility] 10/09/2015, 8:35 AM    By signing my name below, I, Zadie Cleverly attest that this documentation has been prepared under the direction and in the presence of [name], MD Electronically signed: Zadie Cleverly  10/09/2015   I personally performed the services described in this documentation. All medical record entries made by the scribe were at my direction. I have reviewed the chart and agree that the record reflects my personal performance and is accurate and complete. [name], MD

## 2015-10-10 DIAGNOSIS — F29 Unspecified psychosis not due to a substance or known physiological condition: Secondary | ICD-10-CM

## 2015-10-10 MED ORDER — CLONAZEPAM 0.5 MG PO TABS
0.5000 mg | ORAL_TABLET | Freq: Three times a day (TID) | ORAL | Status: DC | PRN
  Administered 2015-10-10 – 2015-10-13 (×5): 0.5 mg via ORAL
  Filled 2015-10-10 (×5): qty 1

## 2015-10-10 MED ORDER — DIPHENOXYLATE-ATROPINE 2.5-0.025 MG PO TABS
1.0000 | ORAL_TABLET | Freq: Once | ORAL | Status: AC
  Administered 2015-10-10: 1 via ORAL
  Filled 2015-10-10: qty 1

## 2015-10-10 NOTE — Progress Notes (Signed)
Amy BranchRobin Howe is a 54 year old female with past medical history of CVA with residual left sided hemiparesis who presented to the [facility] after making suicidal threats at her SNF and not acting herself. She has been recommended for geropsychiatric treatment due to psychotic symptoms and multiple medical problems. However, due to her age of being less than 1455 the patient does not qualify. Patient has ongoing psychiatric symptoms per nursing notes to include delusions and hallucinations. The patient will need psychiatric care at a facility that can also manage her medical needs.

## 2015-10-10 NOTE — Progress Notes (Signed)
Patient agitated, thinks she is in [place] and wants to call the police. Patient is angry that staff will not take her to her car so she can drive away, when reoriented patient becomes angry and unable to reason.

## 2015-10-10 NOTE — Progress Notes (Signed)
PROGRESS NOTE  Amy Howe WUJ:[id number] DOB: 08/30/1961 DOA: 10/06/2015 PCP: No primary care provider on file.  Summary: 89 yof PMH CVA with residual left sided hemiparesis presented to the ED after making suicidal threats at her SNF and not acting herself. Per ED note, she has been being treated for a UTI with an unknown abx for an unknown amount of time. While in the ED, UA revealed a possible infection however all other labs were unremarkable. Head CT was negative for any acute disease. She was admitted for further management.   Assessment/Plan: 1. Metabolic encephalopathy, driving force psychiatric. Stable. Waxing and waning. 2. Suspected underlying mental illness with acute exacerbation, delusions. Stable, remains confused. 3. UTI. Resolved, on oral abx 4. AKI. Resolved 5. Macrocytic anemia. 6. PMH CVA.  7. Social. No SI.  .  Transfer to geri-psych when bed available.  Patient remains medically clear.  Code Status: Full DVT prophylaxis: SCDs Family Communication: Discussed with patient who understands and has no concerns at this time. Disposition Plan: Transfer to geri-psych once bed is available.   Brendia Sacksaniel Goodrich, MD  [facility]  Pager [phone] If 7PM-7AM, please contact night-coverage at [email or web address], password [email or web address] 10/10/2015, 7:39 AM  LOS: 3 days   Consultants:    Procedures:    Antibiotics:  Rocephin 10/27>>10/30  Ceftin 10/30>> 11/2  HPI/Subjective: Denies any pain, nausea, vomiting, SOB. Able to eat.  Objective: Filed Vitals:   10/09/15 0500 10/09/15 0642 10/10/15 0000 10/10/15 0440  BP:  132/55 93/62 97/62   Pulse:  82  79  Temp:  98 F (36.7 C) 98.5 F (36.9 C) 98.5 F (36.9 C)  TempSrc:  Oral Oral Oral  Resp:  20 20 20   Height:      Weight: 83.099 kg (183 lb 3.2 oz)   80.7 kg (177 lb 14.6 oz)  SpO2:  100% 98% 97%    Intake/Output Summary (Last 24 hours) at 10/10/15 0739 Last data filed at 10/09/15 1711  Gross per 24 hour   Intake    600 ml  Output      2 ml  Net    598 ml     Filed Weights   10/09/15 0045 10/09/15 0500 10/10/15 0440  Weight: 83.099 kg (183 lb 3.2 oz) 83.099 kg (183 lb 3.2 oz) 80.7 kg (177 lb 14.6 oz)    Exam:     VSS, afebrile, not hypoxic General:  Appears calm and comfortable Cardiovascular: RRR, no m/r/g. No LE edema. Respiratory: CTA bilaterally, no w/r/r. Normal respiratory effort. Psychiatric: grossly normal mood and affect, speech fluent and appropriate. Oriented to self, place, year  New data reviewed:  None  Scheduled Meds: . cefUROXime  500 mg Oral BID WC  . citalopram  20 mg Oral Daily  . divalproex  250 mg Oral Daily  . divalproex  375 mg Oral QHS  . levothyroxine  25 mcg Oral QAC breakfast   Continuous Infusions:    Principal Problem:   Metabolic encephalopathy Active Problems:   UTI (lower urinary tract infection)   Stroke (HCC)   Suicidal behavior   Time spent: 15 minutes    By signing my name below, I, Zadie CleverlyJessica Augustus attest that this documentation has been prepared under the direction and in the presence of Brendia Sacksaniel Goodrich, MD Electronically signed: Zadie CleverlyJessica Augustus  10/10/2015 1:43pm  I personally performed the services described in this documentation. All medical record entries made by the scribe were at my direction. I have reviewed the chart and agree that  the record reflects my personal performance and is accurate and complete. Murray Hodgkins, MD

## 2015-10-10 NOTE — Progress Notes (Addendum)
[facility] form completed and faxed to [phone] [facility].  [facility] and was advised that referral form has been received and is now being reviewed. Awaiting call back from [facility] with an authorization number for [facility].  Referral was followed up at: [facility] - per RN Vincenza HewsShane, referral is in the stack and to be reviewed, d/cs on 11/01. [facility] - left voicemail. [facility] - left voicemail.  Declined at: [facility], [facility], [facility], [facility], [facility] due to medical acuity and [facility] due younger than 53 y.o.  CSW will continue to seek placement for patient.  Melbourne Abtsatia Bahar Shelden, LCSWA Disposition staff 10/10/2015 10:43 PM

## 2015-10-10 NOTE — Progress Notes (Signed)
Patient has been more alert during shift, although still has hallucinations.  Patient more cooperative, although personality is still variable.  Patient has had multiple stools during shift, otherwise stable.

## 2015-10-10 NOTE — Plan of Care (Signed)
Problem: Consults Goal: General Medical Patient Education See Patient Education Module for specific education.  Outcome: Not Met (add Reason) Patient is agitated      

## 2015-10-10 NOTE — Progress Notes (Signed)
Psychiatry has recommended inpatient treatment for pt.  [facility] has been declined at: [facility]  All due to pt's medical acuity being too high for their adult units. [facility]  Due to not meeting age requirement for Geriatric bed.  CSW referred pt to: [facility] (had previously been referred to geriatric unit, but made referral to med-psych unit- per Bonita QuinLinda- age requirement is 65+, but will consider pt's referral based on her medical needs) [facility]- per Drue Flirtandy, unsure if med-psych unit bed will open today, but send referral so it can be on hand if so (stated that RARF form did not have to be sent at this time) [facility]- per Albin Fellingarla, will review for appropriateness for adult unit  Other facilities contacted are not accepting referrals at this time. Will continue seeking placement.  Ilean SkillMeghan Killian Schwer, MSW, LCSW Clinical Social Work, Disposition  10/10/2015 [phone]

## 2015-10-11 NOTE — Progress Notes (Addendum)
[facility] per Britta MccreedyBarbara, intake RN.   [facility] ([place]) and [facility]) called back to decline pt based on medical acuity.  Spoke with Candy at [facility] who states she will have intake RN call back when pt's referral is reviewed. [facility]- same per [employer].  Ilean SkillMeghan Kayleigh Broadwell, MSW, LCSW Clinical Social Work, Disposition  10/11/2015 [phone]

## 2015-10-11 NOTE — Progress Notes (Signed)
PROGRESS NOTE  Amy MuscaRobin L Mikels ZOX:[id number] DOB: 08/09/1961 DOA: 10/06/2015 PCP: No primary care provider on file.  Summary: [age] yof PMH CVA with residual left sided hemiparesis presented to the ED after making suicidal threats at her SNF and not acting herself. Per ED note, she has been being treated for a UTI with an unknown abx for an unknown amount of time. While in the ED, UA revealed a possible infection however all other labs were unremarkable. Head CT was negative for any acute disease. She was admitted for further management.   Assessment/Plan: 1. Metabolic encephalopathy, driving force psychiatric. Remains stable.  2. Suspected underlying mental illness with acute exacerbation, delusions. Remains calm and less confused.  3. UTI. Resolved, will continue oral abx until finish date.  4. AKI. Resolved 5. Macrocytic anemia. 6. PMH CVA.  7. Social. No SI.  .  Transfer to geri-psych when bed available.  Patient remains medically clear.  Code Status: Full DVT prophylaxis: SCDs Family Communication: Discussed with patient who understands and has no concerns at this time. Disposition Plan: Transfer to geri-psych once bed is available.   Brendia Sacksaniel Abhi Moccia, MD  [facility]  Pager [phone] If 7PM-7AM, please contact night-coverage at [email or web address], password [email or web address] 10/11/2015, 8:36 AM  LOS: 4 days   Consultants:    Procedures:    Antibiotics:  Rocephin 10/27>>10/30  Ceftin 10/30>> 11/2  HPI/Subjective: Feels fine.   Objective: Filed Vitals:   10/10/15 0000 10/10/15 0440 10/10/15 1505 10/11/15 0352  BP: 93/62 97/62 84/53    Pulse:  79 80   Temp: 98.5 F (36.9 C) 98.5 F (36.9 C) 98.7 F (37.1 C)   TempSrc: Oral Oral Oral   Resp: 20 20 20    Height:      Weight:  80.7 kg (177 lb 14.6 oz)  80.7 kg (177 lb 14.6 oz)  SpO2: 98% 97% 100%     Intake/Output Summary (Last 24 hours) at 10/11/15 0836 Last data filed at 10/11/15 0800  Gross per 24 hour  Intake     400 ml  Output      0 ml  Net    400 ml     Filed Weights   10/09/15 0500 10/10/15 0440 10/11/15 0352  Weight: 83.099 kg (183 lb 3.2 oz) 80.7 kg (177 lb 14.6 oz) 80.7 kg (177 lb 14.6 oz)    Exam:     VSS, afebrile, not hypoxic General:  Appears calm and comfortable. Lying in bed   Cardiovascular: RRR, no m/r/g. No LE edema. Respiratory: CTA bilaterally, no w/r/r. Normal respiratory effort. Psychiatric: Alerts easily, speech fluent and clear  New data reviewed:  None  Scheduled Meds: . cefUROXime  500 mg Oral BID WC  . citalopram  20 mg Oral Daily  . divalproex  250 mg Oral Daily  . divalproex  375 mg Oral QHS  . levothyroxine  25 mcg Oral QAC breakfast   Continuous Infusions:    Principal Problem:   Metabolic encephalopathy Active Problems:   UTI (lower urinary tract infection)   Stroke (HCC)   Suicidal behavior   Time spent: 10 minutes    By signing my name below, I, Zadie CleverlyJessica Augustus attest that this documentation has been prepared under the direction and in the presence of Brendia Sacksaniel Adanely Reynoso, MD Electronically signed: Zadie CleverlyJessica Augustus  10/11/2015 12:07  I personally performed the services described in this documentation. All medical record entries made by the scribe were at my direction. I have reviewed the chart and agree that the record  reflects my personal performance and is accurate and complete. Murray Hodgkins, MD

## 2015-10-11 NOTE — Clinical Social Work Note (Signed)
CSW spoke with Amy Howe, disposition CSW. [facility] Pt is medically stable for transfer when bed available. Will continue to follow.  Derenda FennelKara Congetta Odriscoll, LCSW [phone]

## 2015-10-11 NOTE — Progress Notes (Addendum)
[facility] provided authorization [id number] [facility].  [facility] and spoke with Jonny RuizJohn in intake to provide verbal report of pt's demographics and presenting clinical info. States pt's referral will be reviewed and [facility] will be in contact re: addt'l information needed and of disposition once referral is processed.  Med psych units-  [facility]- per [place] referral has not yet been reviewed- "We have a lot but hope to get to it today and will call you back" - notes med psych unit has age requirement of 65+ but they are willing to consider based on medical needs [facility]- unable to reach intake or a voicemail. Will continue attempting contact throughout the day [facility]- left voicemail inquiring as to status of referral (note facility does not have med psych unit but 10/31 was willing to review)   Ilean SkillMeghan Amazin Pincock, MSW, LCSW Clinical Social Work, Disposition  10/11/2015 [phone]

## 2015-10-11 NOTE — Plan of Care (Signed)
Problem: Consults Goal: General Medical Patient Education See Patient Education Module for specific education.  Outcome: Not Met (add Reason) Patient disoriented and irritable

## 2015-10-12 DIAGNOSIS — G9341 Metabolic encephalopathy: Secondary | ICD-10-CM

## 2015-10-12 DIAGNOSIS — R45851 Suicidal ideations: Secondary | ICD-10-CM

## 2015-10-12 DIAGNOSIS — N390 Urinary tract infection, site not specified: Secondary | ICD-10-CM

## 2015-10-12 MED ORDER — NICOTINE 21 MG/24HR TD PT24
21.0000 mg | MEDICATED_PATCH | Freq: Every day | TRANSDERMAL | Status: DC
  Administered 2015-10-12 – 2015-10-13 (×3): 21 mg via TRANSDERMAL
  Filled 2015-10-12 (×3): qty 1

## 2015-10-12 MED ORDER — HYDROCODONE-ACETAMINOPHEN 10-325 MG PO TABS
1.0000 | ORAL_TABLET | Freq: Once | ORAL | Status: AC
  Administered 2015-10-12: 1 via ORAL
  Filled 2015-10-12: qty 1

## 2015-10-12 NOTE — Plan of Care (Signed)
Problem: Phase I Progression Outcomes Goal: OOB as tolerated unless otherwise ordered Outcome: Not Progressing Pt has right side paralysis. Goal: Initial discharge plan identified Outcome: Progressing See MD's note. Goal: Hemodynamically stable Outcome: Progressing See flowsheet.

## 2015-10-12 NOTE — NC FL2 (Signed)
[place] MEDICAID FL2 LEVEL OF CARE SCREENING TOOL     IDENTIFICATION  Patient Name: Amy Howe Birthdate: 1961/05/21 Sex: female Admission Date (Current Location): 10/06/2015  [place] Number: Amy Howe [id number] Facility and Address:  [facility],  [address], [place] [address]      Provider Number: [id number]  Attending Physician Name and Address:  Micael Hampshire Acost*  Relative Name and Phone Number:       Current Level of Care: Hospital Recommended Level of Care: Nursing Facility Prior Approval Number:    Date Approved/Denied:   PASRR Number:    Discharge Plan: SNF    Current Diagnoses: Patient Active Problem List   Diagnosis Date Noted  . UTI (lower urinary tract infection) 10/06/2015  . Metabolic encephalopathy 10/06/2015  . Suicidal behavior 10/06/2015  . Stroke (HCC)   . Cerebrovascular accident (CVA) (HCC)     Orientation ACTIVITIES/SOCIAL BLADDER RESPIRATION    Self    Incontinent Normal  BEHAVIORAL SYMPTOMS/MOOD NEUROLOGICAL BOWEL NUTRITION STATUS  Other (Comment) (swatting occasionally at nurses)   Incontinent Diet (Regular)  PHYSICIAN VISITS COMMUNICATION OF NEEDS Height & Weight Skin    Verbally  (172.7 cm) 177 lbs. Other (Comment) (moisture associated skin damage to sacrum. blister to right heel with foam dressing)          AMBULATORY STATUS RESPIRATION     (not ambulatory) Normal      Personal Care Assistance Level of Assistance  Dressing, Feeding, Bathing Bathing Assistance: Maximum assistance Feeding assistance: Limited assistance Dressing Assistance: Maximum assistance      Functional Limitations Info  Sight, Hearing, Speech Sight Info: Adequate Hearing Info: Adequate Speech Info: Adequate       SPECIAL CARE FACTORS FREQUENCY                      Additional Factors Info  Code Status, Psychotropic Code Status Info: Full code   Psychotropic Info: Celexa, Depakote,  Klonopin         Current Medications (10/12/2015): Current Facility-Administered Medications  Medication Dose Route Frequency Provider Last Rate Last Dose  . cefUROXime (CEFTIN) tablet 500 mg  500 mg Oral BID WC Standley Brooking, MD   500 mg at 10/11/15 1651  . citalopram (CELEXA) tablet 20 mg  20 mg Oral Daily Standley Brooking, MD   20 mg at 10/11/15 0820  . clonazePAM (KLONOPIN) tablet 0.5 mg  0.5 mg Oral TID PRN Standley Brooking, MD   0.5 mg at 10/11/15 1951  . divalproex (DEPAKOTE ER) 24 hr tablet 250 mg  250 mg Oral Daily Standley Brooking, MD   250 mg at 10/11/15 0820  . divalproex (DEPAKOTE) DR tablet 375 mg  375 mg Oral QHS Standley Brooking, MD   375 mg at 10/11/15 1951  . levothyroxine (SYNTHROID, LEVOTHROID) tablet 25 mcg  25 mcg Oral QAC breakfast Standley Brooking, MD   25 mcg at 10/11/15 0820  . nicotine (NICODERM CQ - dosed in mg/24 hours) patch 21 mg  21 mg Transdermal Daily Leda Gauze, NP   21 mg at 10/12/15 0105  . ondansetron (ZOFRAN) tablet 4 mg  4 mg Oral Q6H PRN Haydee Monica, MD   4 mg at 10/09/15 1936   Or  . ondansetron [facility]) injection 4 mg  4 mg Intravenous Q6H PRN Haydee Monica, MD   4 mg at 10/10/15 0138   Do not use this list as  official medication orders. Please verify with discharge summary.  Discharge Medications:   Medication List    STOP taking these medications        buPROPion 150 MG 12 hr tablet  Commonly known as:  WELLBUTRIN SR     FERROUSUL 325 (65 FE) MG tablet  Generic drug:  ferrous sulfate     HYDROcodone-acetaminophen 10-325 MG tablet  Commonly known as:  NORCO     INVANZ 1 G injection  Generic drug:  ertapenem     mirtazapine 15 MG tablet  Commonly known as:  REMERON     risperiDONE 0.25 MG tablet  Commonly known as:  RISPERDAL     traZODone 50 MG tablet  Commonly known as:  DESYREL      TAKE these medications        calcium-vitamin D 500-200 MG-UNIT tablet  Commonly known as:  OSCAL WITH D  Take 1  tablet by mouth 2 (two) times daily.     cefUROXime 500 MG tablet  Commonly known as:  CEFTIN  Take 1 tablet (500 mg total) by mouth 2 (two) times daily with a meal. Last dose 11/2.     citalopram 20 MG tablet  Commonly known as:  CELEXA  Take 20 mg by mouth daily.     clonazePAM 0.5 MG tablet  Commonly known as:  KLONOPIN  Take 1 tablet (0.5 mg total) by mouth 2 (two) times daily as needed (anxiety).     divalproex 250 MG 24 hr tablet  Commonly known as:  DEPAKOTE ER  Take 250 mg by mouth daily.     divalproex 125 MG DR tablet  Commonly known as:  DEPAKOTE  Take 375 mg by mouth at bedtime.     docusate sodium 100 MG capsule  Commonly known as:  COLACE  Take 100 mg by mouth 2 (two) times daily.     gabapentin 100 MG capsule  Commonly known as:  NEURONTIN  Take 100 mg by mouth 3 (three) times daily.     HEMOCYTE PLUS 106-1 MG Caps  Take 1 capsule by mouth daily.     levothyroxine 25 MCG tablet  Commonly known as:  SYNTHROID, LEVOTHROID  Take 25 mcg by mouth daily before breakfast.     omeprazole 20 MG capsule  Commonly known as:  PRILOSEC  Take 20 mg by mouth daily.     OXYGEN  Inhale 2 L into the lungs every 8 (eight) hours as needed (for shortness of breath).     potassium chloride 10 MEQ tablet  Commonly known as:  K-DUR  Take 10 mEq by mouth daily.     senna 8.6 MG tablet  Commonly known as:  SENOKOT  Take 1 tablet by mouth daily.        Relevant Imaging Results:  Relevant Lab Results:  Recent Labs    Additional Information psychiatrically cleared 10/12/15  Karn CassisStultz, Ceylon Arenson Shanaberger, LCSW [phone]

## 2015-10-12 NOTE — Clinical Social Work Note (Signed)
Pt has been psychiatrically cleared. [facility] and [facility] will assess pt this afternoon. Updated pasarr and pt will require face to face evaluation. [facility] and [facility] aware.   Derenda FennelKara Jamielynn Wigley, LCSW [phone]

## 2015-10-12 NOTE — Progress Notes (Signed)
1745- pt yelling, obscene, threw dinner tray to the door of the room.  Security called and PRN medicine given.     1820- pt cursing staff and yelling again, security called.   Paged Dr Ardyth HarpsHernandez to update on pt.  Will continue to monitor pt

## 2015-10-12 NOTE — Progress Notes (Signed)
Pt is refusing AM meds at this time.  Will continue to monitor.

## 2015-10-12 NOTE — Care Management Note (Signed)
Case Management Note  Patient Details  Name: Patience MuscaRobin L Scaletta MRN: [id number] Date of Birth: 02/18/1961  Subjective/Objective:                    Action/Plan: Pt cleared by psych, CSW working on return to facility. Pt will need level 2 PASSAR.    Expected Discharge Date:                  Expected Discharge Plan:  Skilled Nursing Facility  In-House Referral:  Clinical Social Work  Discharge planning Services  CM Consult  Post Acute Care Choice:  NA Choice offered to:  NA  DME Arranged:    DME Agency:     HH Arranged:    HH Agency:     Status of Service:  Completed, signed off  Medicare Important Message Given:  Yes-second notification given Date Medicare IM Given:    Medicare IM give by:    Date Additional Medicare IM Given:    Additional Medicare Important Message give by:     If discussed at Long Length of Stay Meetings, dates discussed:  10/11/2015  Additional Comments:  Malcolm MetroChildress, Romari Gasparro Demske, RN 10/12/2015, 2:27 PM

## 2015-10-12 NOTE — Progress Notes (Signed)
[facility] PROGRESS NOTE  Amy Howe VWU:[id number] DOB: 1961-04-24 DOA: 10/06/2015 PCP: No primary care provider on file.  Assessment/Plan: Metabolic Encephalopathy -Appears resolved at this point. -Was reassessed by psychiatry today and they are of the opinion that she no longer requires inpatient psychiatric management. -Await level 2 PASSAR for placement.  Mental Illness not specified -Will need continued psychiatric follow up in the OP setting.  UTI -Urine cx with no growth. -Will Dc abx.  ARF -Resolved  Code Status: Full Code Family Communication: Patient only  Disposition Plan: to be determined   Consultants:  None   Antibiotics:  None   Subjective: Very rude, no overnight events. No new complaints.  Objective: Filed Vitals:   10/10/15 1505 10/11/15 0352 10/11/15 1414 10/12/15 0540  BP: 84/53  94/55 91/55  Pulse: 80  81 81  Temp: 98.7 F (37.1 C)  98.2 F (36.8 C) 98 F (36.7 C)  TempSrc: Oral   Oral  Resp: Height:      Weight:  80.7 kg (177 lb 14.6 oz)  80.559 kg (177 lb 9.6 oz)  SpO2: 100%  98% 99%    Intake/Output Summary (Last 24 hours) at 10/12/15 1339 Last data filed at 10/11/15 1834  Gross per 24 hour  Intake      0 ml  Output      0 ml  Net      0 ml   Filed Weights   10/10/15 0440 10/11/15 0352 10/12/15 0540  Weight: 80.7 kg (177 lb 14.6 oz) 80.7 kg (177 lb 14.6 oz) 80.559 kg (177 lb 9.6 oz)    Exam:   General:  AA Ox3  Cardiovascular: RRR  Respiratory: CTA B  Abdomen: S/NT/ND/+BS  Extremities: no C/C/E   Neurologic:  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  Data Reviewed: Basic Metabolic Panel:  Recent Labs Lab 10/06/15 1521 10/07/15 0550 10/08/15 0840  NA 138 139 139  K 4.3 3.3* 3.3*  CL 107 112* 111  CO2 23 21* 20*  GLUCOSE 73 72 93  BUN CREATININE 1.69* 1.44* 1.29*  CALCIUM 9.2 8.4* 8.5*   Liver Function Tests:  Recent Labs Lab 10/06/15 1521  AST 34  ALT 15   ALKPHOS 251*  BILITOT 0.6  PROT 5.1*  ALBUMIN 1.7*   No results for input(s): LIPASE, AMYLASE in the last 168 hours. No results for input(s): AMMONIA in the last 168 hours. CBC:  Recent Labs Lab 10/06/15 1521 10/07/15 0550 10/08/15 0840  WBC 6.2 5.5 6.2  NEUTROABS 3.6  --   --   HGB 11.2* 10.6* 11.3*  HCT 35.1* 32.9* 35.2*  MCV 103.2* 102.2* 102.0*  PLT 263 247 263   Cardiac Enzymes: No results for input(s): CKTOTAL, CKMB, CKMBINDEX, TROPONINI in the last 168 hours. BNP (last 3 results) No results for input(s): BNP in the last 8760 hours.  ProBNP (last 3 results) No results for input(s): PROBNP in the last 8760 hours.  CBG:  Recent Labs Lab 10/06/15 1547  GLUCAP 73    Recent Results (from the past 240 hour(s))  Urine culture     Status: None   Collection Time: 10/06/15  6:06 PM  Result Value Ref Range Status   Specimen Description URINE, CATHETERIZED  Final   Special Requests NONE  Final   Culture   Final    NO GROWTH 1 DAY Performed at [facility]    Report Status 10/08/2015 FINAL  Final     Studies: No results found.  Scheduled Meds: . cefUROXime  500 mg Oral BID WC  . citalopram  20 mg Oral Daily  . divalproex  250 mg Oral Daily  . divalproex  375 mg Oral QHS  . levothyroxine  25 mcg Oral QAC breakfast  . nicotine  21 mg Transdermal Daily   Continuous Infusions:   Principal Problem:   Metabolic encephalopathy Active Problems:   UTI (lower urinary tract infection)   Stroke (HCC)   Suicidal behavior    Time spent: 25 minutes. Greater than 50% of this time was spent in direct contact with the patient coordinating care.    Amy Howe,Amy Howe  [facility] Pager [phone]  If 7PM-7AM, please contact night-coverage at [email or web address], password [email or web address] 10/12/2015, 1:39 PM  LOS: 5 days

## 2015-10-12 NOTE — Consult Note (Signed)
Telepsych Consultation   Reason for Consult:  Suicidal ideation Referring Physician:  APED PatienLAURIANN MILILLOation: [name] Howe:  [id number] Principal Diagnosis: Metabolic encephalopathy Diagnosis:   Patient Active Problem List   Diagnosis Date Noted  . UTI (lower urinary tract infection) [N39.0] 10/06/2015  . Metabolic encephalopathy [G93.41] 10/06/2015  . Suicidal behavior [F48.9] 10/06/2015  . Stroke [facility]) [I63.9]   . Cerebrovascular accident (CVA) (HCC) [I63.9]     Total Time spent with patient: 45 minutes  Subjective:   Amy Howe is a 54 y.o. female patient admitted with suicidal ideation.  HPI:  [name], 54 yo female came in on 10/29 from [facility] with suicidal ideations.  She stated at the time she was having a baby.  The SNF also reported that she was sleeping with a pocket knife.  Today, she was seen.  Although irritable, she was alert and oriented.  She denies SI/HI/AVH.  She stated she was ready to go back to the "home" and that she was getting tired of the "hospital."  She has been compliant taking her Depakote and Celexa.  HPI Elements:   See HPI  Past Medical History:  Past Medical History  Diagnosis Date  . Stroke [facility])     Past Surgical History  Procedure Laterality Date  . Tonsillectomy    . Replacement total knee    . Shoulder surgery    . Abdominal hysterectomy     Family History: History reviewed. No pertinent family history. Social History:  History  Alcohol Use No     History  Drug Use No    Social History   Social History  . Marital Status: Divorced    Spouse Name: N/A  . Number of Children: N/A  . Years of Education: N/A   Social History Main Topics  . Smoking status: Current Every Day Smoker  . Smokeless tobacco: None  . Alcohol Use: No  . Drug Use: No  . Sexual Activity: Not Asked   Other Topics Concern  . None   Social History Narrative  . None   Additional Social History:    Pain Medications: pt  denies abuse Prescriptions: pt denies abuse  Over the Counter: pt denies abuse History of alcohol / drug use?: No history of alcohol / drug abuse                     Allergies:  No Known Allergies  Labs: No results found for this or any previous visit (from the past 48 hour(s)).  Vitals: Blood pressure 91/55, pulse 81, temperature 98 F (36.7 C), temperature source Oral, resp. rate 18, height  (1.727 m), weight 80.559 kg (177 lb 9.6 oz), SpO2 99 %.  Risk to Self: Suicidal Ideation: No Suicidal Intent: No Is patient at risk for suicide?: No Suicidal Plan?: No Access to Means: No What has been your use of drugs/alcohol within the last 12 months?: pt denies use How many times?: 0 Other Self Harm Risks: none Triggers for Past Attempts:  (n/a) Intentional Self Injurious Behavior: None Risk to Others: Homicidal Ideation: No Thoughts of Harm to Others: No Current Homicidal Intent: No Current Homicidal Plan: No Access to Homicidal Means: No Identified Victim: none History of harm to others?: No Assessment of Violence: None Noted Violent Behavior Description: pt denies hx violence Does patient have access to weapons?: No Criminal Charges Pending?: No Does patient have a court date: No Prior Inpatient Therapy: Prior Inpatient Therapy: No  Prior Therapy Dates: na Prior Therapy Facilty/Provider(s): na Reason for Treatment: na Prior Outpatient Therapy: Prior Outpatient Therapy: Yes Prior Therapy Dates: several yrs ago Prior Therapy Facilty/Provider(s): pt sts saw therapist and was Rx antidepressants Does patient have an ACCT team?: No Does patient have Intensive In-House Services?  : No Does patient have [facility] services? : No Does patient have P4CC services?: No  Current Facility-Administered Medications  Medication Dose Route Frequency Provider Last Rate Last Dose  . cefUROXime (CEFTIN) tablet 500 mg  500 mg Oral BID WC Standley Brookinganiel P Goodrich, MD   500 mg at 10/11/15 1651   . citalopram (CELEXA) tablet 20 mg  20 mg Oral Daily Standley Brookinganiel P Goodrich, MD   20 mg at 10/11/15 0820  . clonazePAM (KLONOPIN) tablet 0.5 mg  0.5 mg Oral TID PRN Standley Brookinganiel P Goodrich, MD   0.5 mg at 10/11/15 1951  . divalproex (DEPAKOTE ER) 24 hr tablet 250 mg  250 mg Oral Daily Standley Brookinganiel P Goodrich, MD   250 mg at 10/11/15 0820  . divalproex (DEPAKOTE) DR tablet 375 mg  375 mg Oral QHS Standley Brookinganiel P Goodrich, MD   375 mg at 10/11/15 1951  . levothyroxine (SYNTHROID, LEVOTHROID) tablet 25 mcg  25 mcg Oral QAC breakfast Standley Brookinganiel P Goodrich, MD   25 mcg at 10/11/15 0820  . nicotine (NICODERM CQ - dosed in mg/24 hours) patch 21 mg  21 mg Transdermal Daily Leda GauzeKaren J Kirby-Graham, NP   21 mg at 10/12/15 0105  . ondansetron (ZOFRAN) tablet 4 mg  4 mg Oral Q6H PRN Haydee Monicaachal A David, MD   4 mg at 10/09/15 1936   Or  . ondansetron [facility](ZOFRAN) injection 4 mg  4 mg Intravenous Q6H PRN Haydee Monicaachal A David, MD   4 mg at 10/10/15 0138    Musculoskeletal: Strength & Muscle Tone: tele psych Gait & Station: Tele psych Patient leans: N/A  Psychiatric Specialty Exam: Physical Exam  Vitals reviewed. Psychiatric: Thought content is not paranoid and not delusional. She expresses no homicidal and no suicidal ideation. She expresses no suicidal plans and no homicidal plans.    Review of Systems  Psychiatric/Behavioral: Negative for depression, suicidal ideas and hallucinations.    Blood pressure 91/55, pulse 81, temperature 98 F (36.7 C), temperature source Oral, resp. rate 18, height 5\' 8"  (1.727 m), weight 80.559 kg (177 lb 9.6 oz), SpO2 99 %.Body mass index is 27.01 kg/(m^2).  General Appearance: Disheveled  Eye Contact::  Good  Speech:  Garbled  Volume:  Normal  Mood:  Euthymic and irritable  Affect:  Appropriate and Congruent  Thought Process:  Coherent and Logical  Orientation:  Full (Time, Place, and Person)  Thought Content:  Rumination  Suicidal Thoughts:  No  Homicidal Thoughts:  No  Memory:  Immediate;    Fair Recent;   Fair Remote;   Fair  Judgement:  Fair  Insight:  Fair  Psychomotor Activity:  Decreased  Concentration:  Fair  Recall:  FiservFair  Fund of Knowledge:Fair  Language: Fair  Akathisia:  Negative  Handed:  Right  AIMS (if indicated):     Assets:  Resilience  ADL's:  Intact  Cognition: WNL  Sleep:   fair   Medical Decision Making: Discuss test with performing physician (1), Decision to obtain old records (1), Review and summation of old records (2) and Review of Last Therapy Session (1)   Treatment Plan Summary: 1.  Take all your medications as prescribed.  2.  Report any adverse side effects to outpatient provider.                       3.  Patient instructed to not use alcohol or illegal drugs while on prescription medicines.            4.  In the event of worsening symptoms, instructed patient to call 911, the crisis hotline or go to nearest emergency room for                                      evaluation of symptoms.  Plan:  No evidence of imminent risk to self or others at present.   Patient does not meet criteria for psychiatric inpatient admission. Supportive therapy provided about ongoing stressors. Discussed crisis plan, support from social network, calling 911, coming to the Emergency Department, and calling Suicide Hotline.  Disposition:  Mentally stable  Velna Hatchet May Khushi Zupko AGNP-BC 10/12/2015 10:08 AM

## 2015-10-12 NOTE — Progress Notes (Signed)
[facility] Junious Dresseronnie.  [facility]: [facility]- per Bonita QuinLinda, hopeful to have beds today- hope to review referral today (notes med psych unit has age requirement of 65+, but will review in case exception can be made based on pt's medical needs) [facility]- per Dorina HoyerJoanie, no med psych beds available today  Case reviewed with Dr. Lucianne MussKumar and Durene FruitsM. Agustin, NP. Pt will be re-evaluated today in order to monitor pt's psychiatric issues and provide appropriate disposition as case progresses.   Spoke with RN, pt oriented to person but not time/situation this morning. Refusing medications. Telepsych to be performed this morning.   Ilean SkillMeghan Yeraldi Fidler, MSW, LCSW Clinical Social Work, Disposition  10/12/2015 [id number]

## 2015-10-13 DIAGNOSIS — F489 Nonpsychotic mental disorder, unspecified: Secondary | ICD-10-CM

## 2015-10-13 NOTE — Progress Notes (Signed)
[facility] PROGRESS NOTE  Amy Howe ZOX:[id number] DOB: August 26, 1961 DOA: 10/06/2015 PCP: No primary care provider on file.  Assessment/Plan: Metabolic Encephalopathy -Appears resolved at this point. -Was reassessed by psychiatry today and they are of the opinion that she no longer requires inpatient psychiatric management. -Await level 2 PASSAR for placement.  Mental Illness not specified -Will need continued psychiatric follow up in the OP setting.  ?UTI -Urine cx with no growth. -Will Dc abx.  ARF -Resolved  Code Status: Full Code Family Communication: Patient only  Disposition Plan: to be determined   Consultants:  None   Antibiotics:  None   Subjective: Very rude, no overnight events. No new complaints. Required a bedside sitter due to behavioral issues.  Objective: Filed Vitals:   10/12/15 0540 10/12/15 1455 10/12/15 2137 10/13/15 0644  BP: 91/55 97/66 102/58 91/50  Pulse: 81 66 84 66  Temp: 98 F (36.7 C) 98.1 F (36.7 C) 99.2 F (37.3 C) 97.9 F (36.6 C)  TempSrc: Oral Oral Oral Oral  Resp: Height:      Weight: 80.559 kg (177 lb 9.6 oz)   77.5 kg (170 lb 13.7 oz)  SpO2: 99% 100% 98% 97%    Intake/Output Summary (Last 24 hours) at 10/13/15 1114 Last data filed at 10/12/15 1900  Gross per 24 hour  Intake      0 ml  Output      0 ml  Net      0 ml   Filed Weights   10/11/15 0352 10/12/15 0540 10/13/15 0644  Weight: 80.7 kg (177 lb 14.6 oz) 80.559 kg (177 lb 9.6 oz) 77.5 kg (170 lb 13.7 oz)    Exam:   General:  AA Ox3  Cardiovascular: RRR  Respiratory: CTA B  Abdomen: S/NT/ND/+BS  Extremities: no C/C/E   Neurologic:  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  Data Reviewed: Basic Metabolic Panel:  Recent Labs Lab 10/06/15 1521 10/07/15 0550 10/08/15 0840  NA 138 139 139  K 4.3 3.3* 3.3*  CL 107 112* 111  CO2 23 21* 20*  GLUCOSE 73 72 93  BUN CREATININE 1.69* 1.44* 1.29*  CALCIUM 9.2 8.4*  8.5*   Liver Function Tests:  Recent Labs Lab 10/06/15 1521  AST 34  ALT 15  ALKPHOS 251*  BILITOT 0.6  PROT 5.1*  ALBUMIN 1.7*   No results for input(s): LIPASE, AMYLASE in the last 168 hours. No results for input(s): AMMONIA in the last 168 hours. CBC:  Recent Labs Lab 10/06/15 1521 10/07/15 0550 10/08/15 0840  WBC 6.2 5.5 6.2  NEUTROABS 3.6  --   --   HGB 11.2* 10.6* 11.3*  HCT 35.1* 32.9* 35.2*  MCV 103.2* 102.2* 102.0*  PLT 263 247 263   Cardiac Enzymes: No results for input(s): CKTOTAL, CKMB, CKMBINDEX, TROPONINI in the last 168 hours. BNP (last 3 results) No results for input(s): BNP in the last 8760 hours.  ProBNP (last 3 results) No results for input(s): PROBNP in the last 8760 hours.  CBG:  Recent Labs Lab 10/06/15 1547  GLUCAP 73    Recent Results (from the past 240 hour(s))  Urine culture     Status: None   Collection Time: 10/06/15  6:06 PM  Result Value Ref Range Status   Specimen Description URINE, CATHETERIZED  Final   Special Requests NONE  Final   Culture   Final    NO GROWTH 1 DAY [facility]    Report Status 10/08/2015 FINAL  Final     Studies: No results found.  Scheduled Meds: . citalopram  20 mg Oral Daily  . divalproex  250 mg Oral Daily  . divalproex  375 mg Oral QHS  . levothyroxine  25 mcg Oral QAC breakfast  . nicotine  21 mg Transdermal Daily   Continuous Infusions:   Principal Problem:   Metabolic encephalopathy Active Problems:   UTI (lower urinary tract infection)   Stroke (HCC)   Suicidal behavior    Time spent: 15 minutes. Greater than 50% of this time was spent in direct contact with the patient coordinating care.    Chaya JanHERNANDEZ ACOSTA,ESTELA  [facility] Pager [phone]  If 7PM-7AM, please contact night-coverage at [email or web address], password [email or web address] 10/13/2015, 11:14 AM  LOS: 6 days

## 2015-10-13 NOTE — Clinical Social Work Note (Signed)
Pt had face-to-face evaluation today for pasarr. [facility] today and is aware of facility's responsibility to take pt back once pasarr received as she appears to be at baseline. Psych/RN notes sent to facility at their request.   Derenda FennelKara Paxton Binns, LCSW [phone]

## 2015-10-13 NOTE — Care Management Note (Signed)
Case Management Note  Patient Details  Name: Patience MuscaRobin L Totherow MRN: [id number] Date of Birth: 10/04/1961  Subjective/Objective:                    Action/Plan:   Expected Discharge Date:                  Expected Discharge Plan:  Skilled Nursing Facility  In-House Referral:  Clinical Social Work  Discharge planning Services  CM Consult  Post Acute Care Choice:  NA Choice offered to:  NA  DME Arranged:    DME Agency:     HH Arranged:    HH Agency:     Status of Service:  Completed, signed off  Medicare Important Message Given:  Yes-second notification given Date Medicare IM Given:    Medicare IM give by:    Date Additional Medicare IM Given:    Additional Medicare Important Message give by:     If discussed at Long Length of Stay Meetings, dates discussed:  10/13/2015  Additional Comments:  Malcolm MetroChildress, Sheala Dosh Demske, RN 10/13/2015, 4:12 PM

## 2015-10-14 MED ORDER — CLONAZEPAM 0.5 MG PO TABS: 0.5000 mg | ORAL_TABLET | Freq: Two times a day (BID) | ORAL | Status: AC

## 2015-10-14 NOTE — Progress Notes (Signed)
EM[facility].    RN received call from Norwood LevoEvelyn Dodson, LPN stating that paperwork had not been received.  [facility]  RN refaxed patient's discharge summary per [facility] request.

## 2015-10-14 NOTE — Progress Notes (Signed)
[facility] PROGRESS NOTE  Amy Howe ZOX:[id number] DOB: [birth year] DOA: 10/06/2015 PCP: No primary care provider on file.  Assessment/Plan: Metabolic Encephalopathy -Appears resolved at this point. -Was reassessed by psychiatry and they are of the opinion that she no longer requires inpatient psychiatric management. -Await level 2 [facility] for placement.  Mental Illness not specified -Will need continued psychiatric follow up in the OP setting.  ?UTI -Urine cx with no growth. -Will Dc abx.  ARF -Resolved  Code Status: Full Code Family Communication: Patient only  Disposition Plan: to be determined   Consultants:  None   Antibiotics:  None   Subjective: Very rude, no overnight events. No new complaints. Required a bedside sitter due to behavioral issues.  Objective: Filed Vitals:   10/13/15 0644 10/13/15 1438 10/13/15 2149 10/14/15 1329  BP: 101/54  Pulse: 66 75 72 84  Temp: 97.9 F (36.6 C) 98.4 F (36.9 C) 98.6 F (37 C) 98.8 F (37.1 C)  TempSrc: Oral Oral Oral Oral  Resp: Height:      Weight: 77.5 kg (170 lb 13.7 oz)     SpO2: 97% 100% 100% 100%    Intake/Output Summary (Last 24 hours) at 10/14/15 1342 Last data filed at 10/13/15 1700  Gross per 24 hour  Intake     60 ml  Output      0 ml  Net     60 ml   Filed Weights   10/11/15 0352 10/12/15 0540 10/13/15 0644  Weight: 80.7 kg (177 lb 14.6 oz) 80.559 kg (177 lb 9.6 oz) 77.5 kg (170 lb 13.7 oz)    Exam:   General:  AA Ox3  Cardiovascular: RRR  Respiratory: CTA B  Abdomen: S/NT/ND/+BS  Extremities: no C/C/E   Neurologic:  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  Data Reviewed: Basic Metabolic Panel:  Recent Labs Lab 10/08/15 0840  NA 139  K 3.3*  CL 111  CO2 20*  GLUCOSE 93  BUN 14  CREATININE 1.29*  CALCIUM 8.5*   Liver Function Tests: No results for input(s): AST, ALT, ALKPHOS, BILITOT, PROT, ALBUMIN in the last 168 hours. No  results for input(s): LIPASE, AMYLASE in the last 168 hours. No results for input(s): AMMONIA in the last 168 hours. CBC:  Recent Labs Lab 10/08/15 0840  WBC 6.2  HGB 11.3*  HCT 35.2*  MCV 102.0*  PLT 263   Cardiac Enzymes: No results for input(s): CKTOTAL, CKMB, CKMBINDEX, TROPONINI in the last 168 hours. BNP (last 3 results) No results for input(s): BNP in the last 8760 hours.  ProBNP (last 3 results) No results for input(s): PROBNP in the last 8760 hours.  CBG: No results for input(s): GLUCAP in the last 168 hours.  Recent Results (from the past 240 hour(s))  Urine culture     Status: None   Collection Time: 10/06/15  6:06 PM  Result Value Ref Range Status   Specimen Description URINE, CATHETERIZED  Final   Special Requests NONE  Final   Culture   Final    NO GROWTH 1 DAY Performed at [facility]    Report Status 10/08/2015 FINAL  Final     Studies: No results found.  Scheduled Meds: . citalopram  20 mg Oral Daily  . divalproex  250 mg Oral Daily  . divalproex  375 mg Oral QHS  . levothyroxine  25 mcg Oral QAC breakfast  . nicotine  21 mg Transdermal Daily  Continuous Infusions:   Principal Problem:   Metabolic encephalopathy Active Problems:   UTI (lower urinary tract infection)   Stroke (HCC)   Suicidal behavior    Time spent: 15 minutes. Greater than 50% of this time was spent in direct contact with the patient coordinating care.    Chaya JanHERNANDEZ ACOSTA,ESTELA  [facility] Pager [phone]  If 7PM-7AM, please contact night-coverage at [email or web address], password [email or web address] 10/14/2015, 1:42 PM  LOS: 7 days

## 2015-10-14 NOTE — Clinical Social Work Note (Signed)
CSW spoke with [place] [facility] regarding [facility] number. They plan to email state to expedite as pt is medically stable.   Derenda FennelKara Vandella Ord, LCSW [phone]

## 2015-10-14 NOTE — Care Management Note (Signed)
Case Management Note  Patient Details  Name: Amy Howe MRN: [id number] Date of Birth: 02/24/1961  Subjective/Objective:                    Action/Plan: Returning to SNF today. CSW has arranged for return to facility. No CM needs.  Expected Discharge Date:   10/14/2015               Expected Discharge Plan:  Skilled Nursing Facility  In-House Referral:  Clinical Social Work  Discharge planning Services  CM Consult  Post Acute Care Choice:  NA Choice offered to:  NA  DME Arranged:    DME Agency:     HH Arranged:    HH Agency:     Status of Service:  Completed, signed off  Medicare Important Message Given:  Yes-third notification given Date Medicare IM Given:    Medicare IM give by:    Date Additional Medicare IM Given:    Additional Medicare Important Message give by:     If discussed at Long Length of Stay Meetings, dates discussed:    Additional Comments:  Malcolm MetroChildress, Donevan Biller Demske, RN 10/14/2015, 4:32 PM

## 2015-10-14 NOTE — Progress Notes (Signed)
1545 Patient refused to be turned today; however, agreed to let RN and NT change bed and gown and turn patient.  Patient resting in bed.

## 2015-10-14 NOTE — Progress Notes (Signed)
Report called to Norwood LevoEvelyn Dodson, LPN at [facility].

## 2015-10-14 NOTE — Care Management Important Message (Signed)
Important Message  Patient Details  Name: Amy MuscaRobin L Barga MRN: [id number] Date of Birth: 05/18/1961   Medicare Important Message Given:  Yes-third notification given    Malcolm MetroChildress, Scotlynn Noyes Demske, RN 10/14/2015, 4:32 PM
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
## 2015-10-14 NOTE — Clinical Social Work Note (Signed)
[facility] number received. Pt and facility aware and agreeable to return today. CSW attempted to reach family on chart, but name on voicemail does not match. Will arrange [facility].  Derenda FennelKara Lastacia Solum, LCSW [phone]

## 2015-12-11 DEATH — deceased

## 2016-06-15 IMAGING — DX DG CHEST 1V
1 series · 1 of 1 positions shown · non-contrast
Comparison: November 14, 2014.

CLINICAL DATA: Confusion, lethargy.

EXAM:
CHEST 1 VIEW

[chest ap]
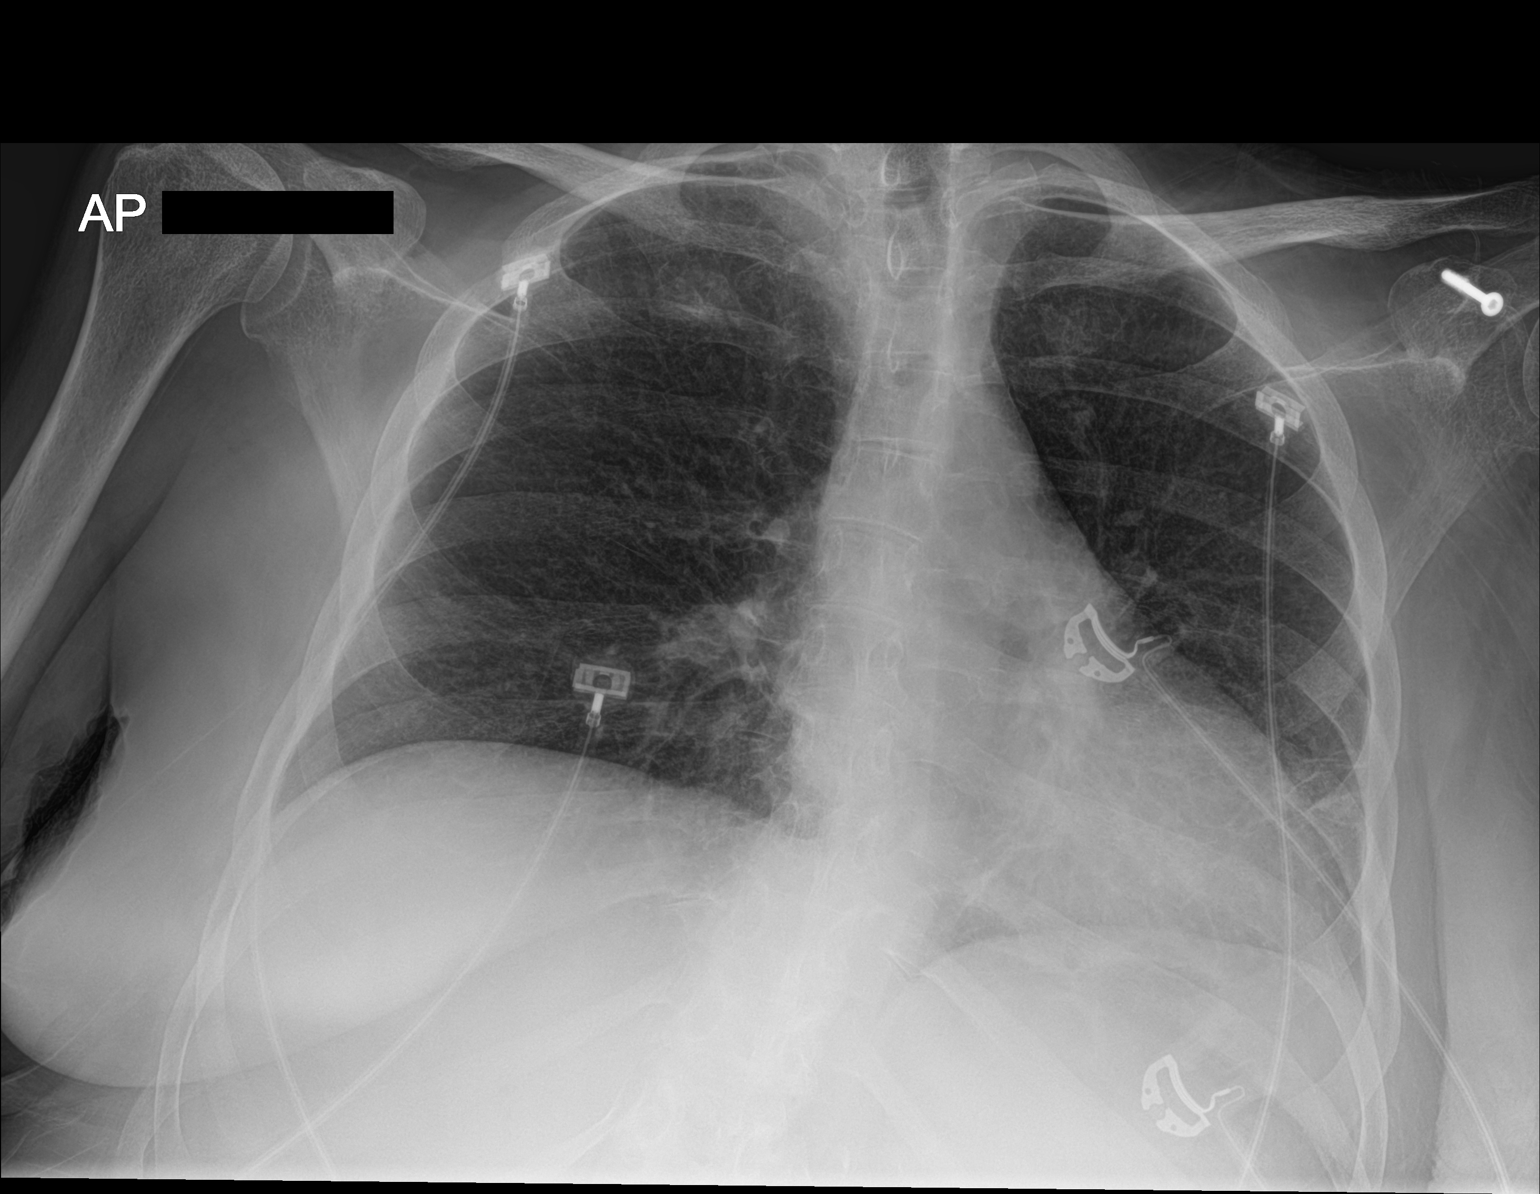

[1 of 1 positions shown; findings below may reference images not displayed]

FINDINGS: The heart size and mediastinal contours are within normal limits.
Both lungs are clear. No pneumothorax or pleural effusion is noted.
The visualized skeletal structures are unremarkable.
IMPRESSION: No active disease.

## 2016-06-15 IMAGING — CT CT HEAD W/O CM
1 series · 15 of 30 positions shown, 19 images · non-contrast
Comparison: 05/12/2005

CLINICAL DATA: Patient presents with lethargy. History of a stroke
and left-sided hemiplegia. Over the course of the last few weeks
patient has had a decreased appetite and generalized weakness.

EXAM:
CT HEAD WITHOUT CONTRAST
TECHNIQUE: Contiguous axial images were obtained from the base of the skull
through the vertex without intravenous contrast.

[Series 2: headseq 4.8 h37s · axial · 0.49mm/px · z∈[+1300,+1460]mm · 15 of 36 slices shown, 19 images]
[im 2/36  brain]
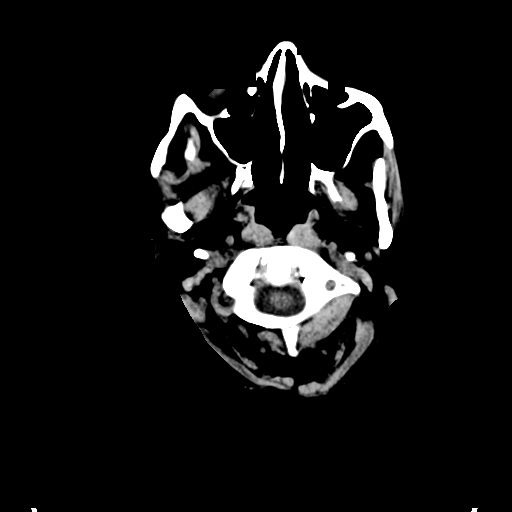
[im 2/36  bone]
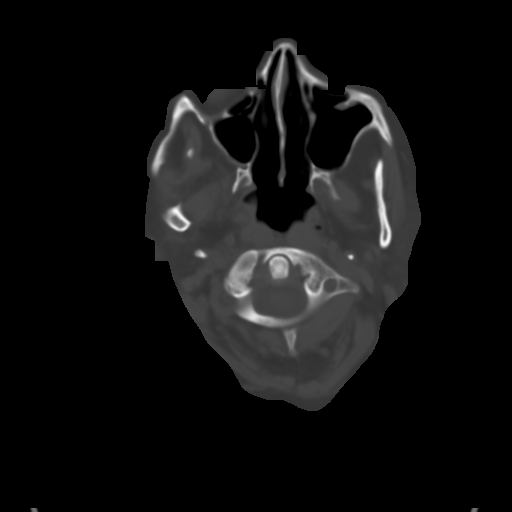
[im 4/36  brain]
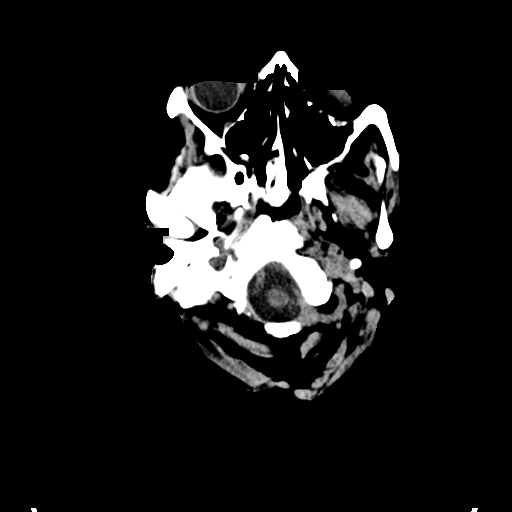
[im 7/36  brain]
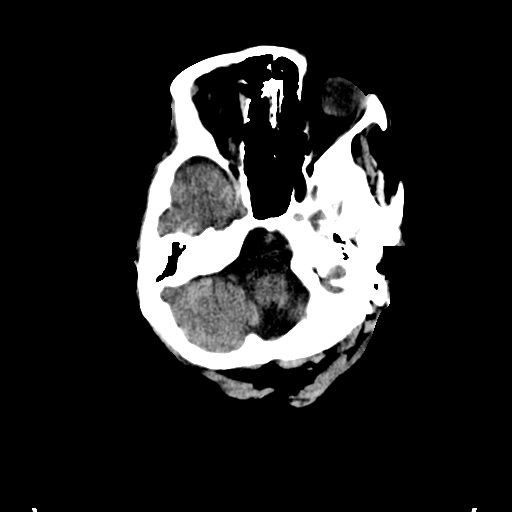
[im 9/36  brain]
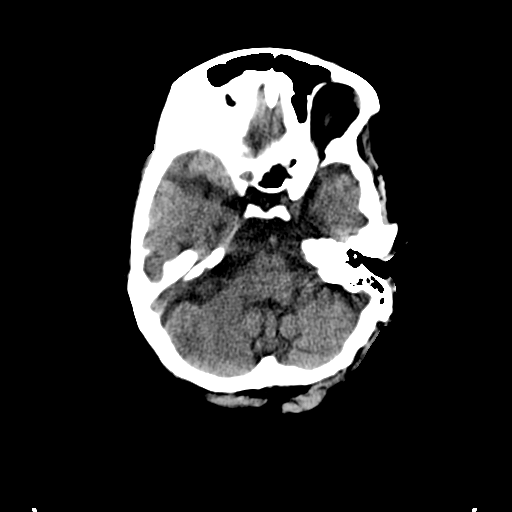
[im 11/36  brain]
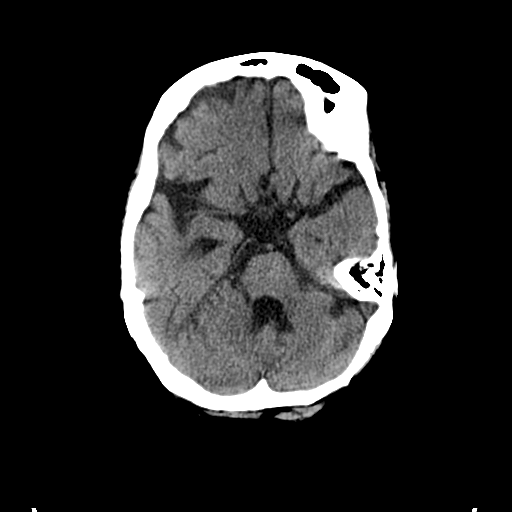
[im 11/36  bone]
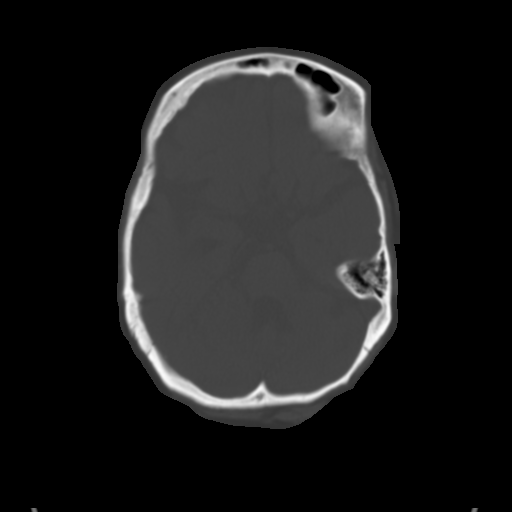
[im 14/36  brain]
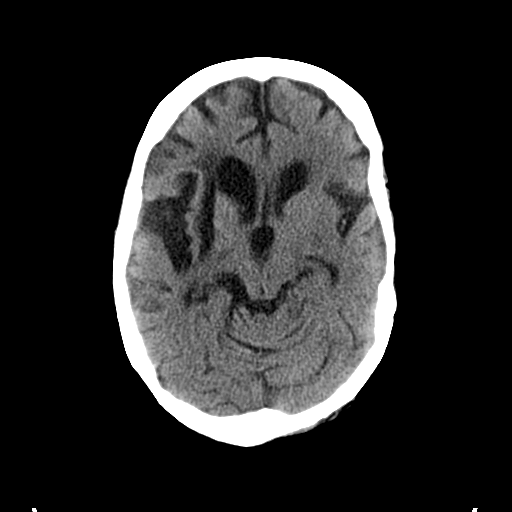
[im 16/36  brain]
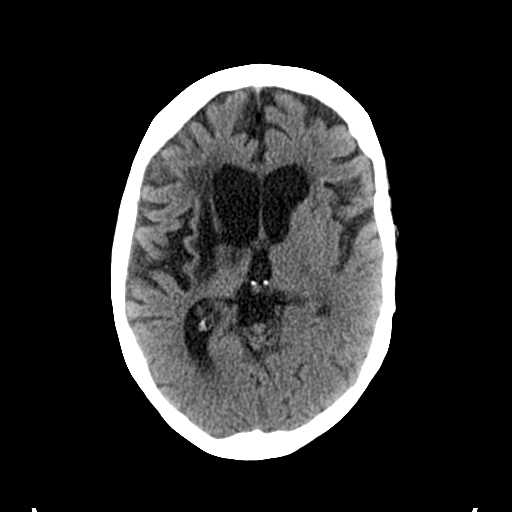
[im 19/36  brain]
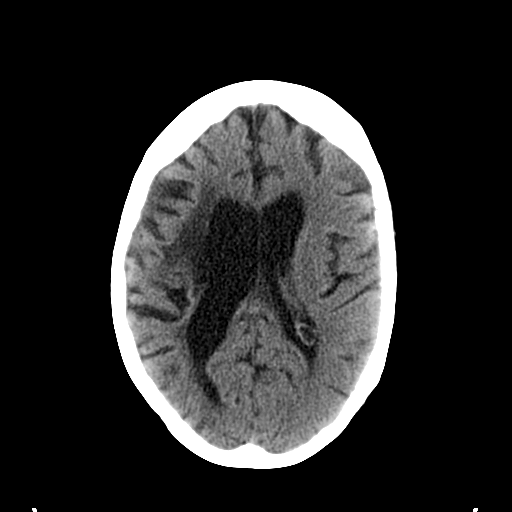
[im 20/36  brain]
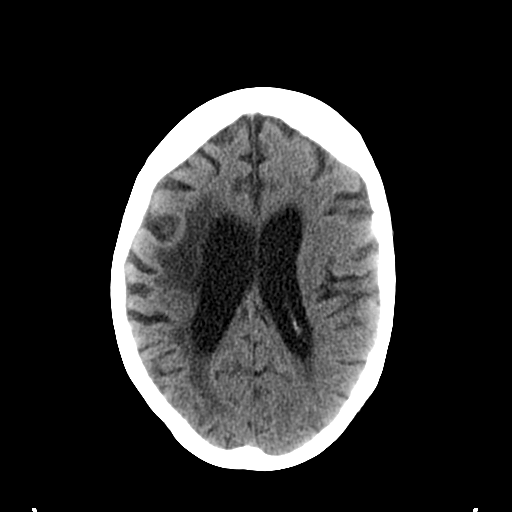
[im 20/36  bone]
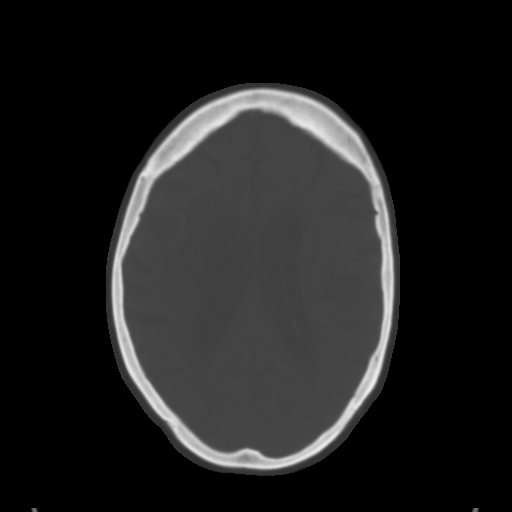
[im 22/36  brain]
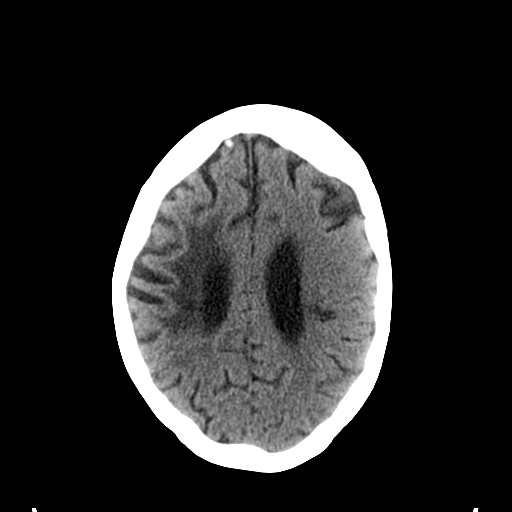
[im 25/36  brain]
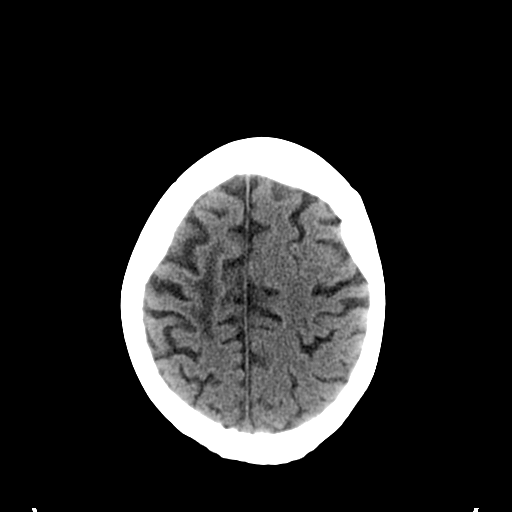
[im 27/36  brain]
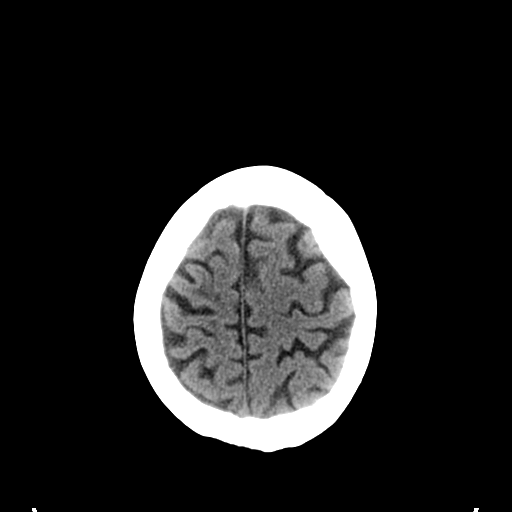
[im 29/36  brain]
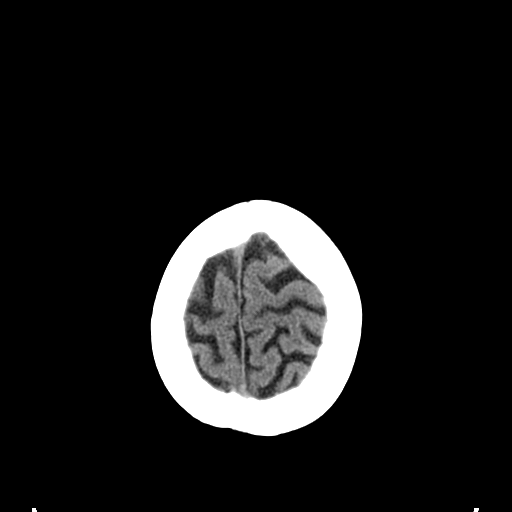
[im 29/36  bone]
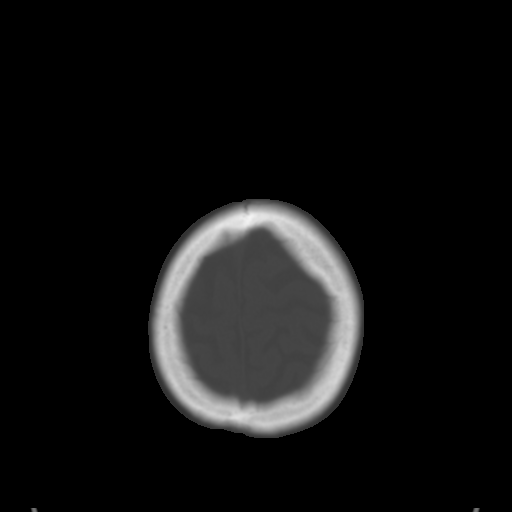
[im 32/36  brain]
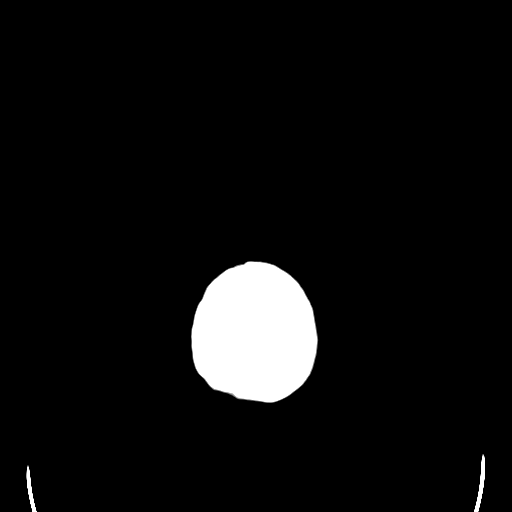
[im 34/36  brain]
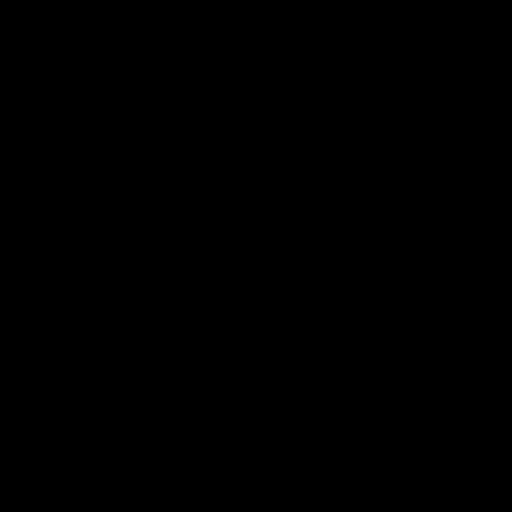

[15 of 30 positions shown; findings below may reference images not displayed]

FINDINGS: The ventricles are normal configuration. There is ventricular and
sulcal enlargement reflecting moderate atrophy, advanced for age. No
hydrocephalus.

There is an old right-sided infarct. Infarct involves the right base
a ganglia extending to the deep right frontal lobe periventricular
white matter. There is hypoattenuation in the white matter of
frontal lobe adjacent to the infarct likely due to chronic
microvascular ischemic change. This is new from the prior study.
There is associated ex vacuo dilation of the right lateral
ventricle.

There are no parenchymal masses or mass effect. There is no evidence
of a recent cortical infarct.

There are no extra-axial masses or abnormal fluid collections.

There is no intracranial hemorrhage.

The visualized sinuses and mastoid air cells are clear. No skull
lesion.
IMPRESSION: 1. No acute intracranial abnormalities.
2. Old right-sided infarct. The infarct is similar to the prior CT.
New hypoattenuation throughout much of the right frontal lobe has
developed since the prior study likely due to chronic microvascular
ischemic change.
3. Moderate atrophy advanced for age and increased when compared to
the prior CT.
